# Patient Record
Sex: Female | Born: 1960 | Race: White | Hispanic: No | Marital: Married | Smoking: Never smoker
Health system: Southern US, Community
[De-identification: ages and names within clinical notes are randomized; demographics above are authoritative.]

## PROBLEM LIST (undated history)

## (undated) DIAGNOSIS — H269 Unspecified cataract: Secondary | ICD-10-CM

## (undated) DIAGNOSIS — Z91018 Allergy to other foods: Secondary | ICD-10-CM

## (undated) DIAGNOSIS — A6920 Lyme disease, unspecified: Secondary | ICD-10-CM

## (undated) DIAGNOSIS — I219 Acute myocardial infarction, unspecified: Secondary | ICD-10-CM

## (undated) DIAGNOSIS — B2790 Infectious mononucleosis, unspecified without complication: Secondary | ICD-10-CM

## (undated) DIAGNOSIS — T782XXA Anaphylactic shock, unspecified, initial encounter: Secondary | ICD-10-CM

## (undated) HISTORY — PX: TUBAL LIGATION: SHX77

## (undated) HISTORY — PX: ABDOMINAL HYSTERECTOMY: SHX81

## (undated) HISTORY — DX: Unspecified cataract: H26.9

---

## 2007-10-11 ENCOUNTER — Encounter: Admission: RE | Admit: 2007-10-11 | Discharge: 2007-10-11 | Payer: Self-pay | Admitting: Family Medicine

## 2007-11-03 ENCOUNTER — Encounter: Admission: RE | Admit: 2007-11-03 | Discharge: 2007-11-03 | Payer: Self-pay | Admitting: Family Medicine

## 2009-03-12 ENCOUNTER — Encounter: Admission: RE | Admit: 2009-03-12 | Discharge: 2009-03-12 | Payer: Self-pay | Admitting: Family Medicine

## 2010-12-06 ENCOUNTER — Encounter: Payer: Self-pay | Admitting: Family Medicine

## 2011-01-04 ENCOUNTER — Emergency Department (HOSPITAL_COMMUNITY): Payer: BC Managed Care – PPO

## 2011-01-04 ENCOUNTER — Observation Stay (HOSPITAL_COMMUNITY)
Admission: EM | Admit: 2011-01-04 | Discharge: 2011-01-05 | DRG: 102 | Disposition: A | Discharge status: Home / Self Care | Payer: BC Managed Care – PPO | Attending: Internal Medicine | Admitting: Internal Medicine

## 2011-01-04 ENCOUNTER — Encounter: Payer: Self-pay | Admitting: Cardiology

## 2011-01-04 DIAGNOSIS — R079 Chest pain, unspecified: Principal | ICD-10-CM | POA: Insufficient documentation

## 2011-01-04 DIAGNOSIS — F329 Major depressive disorder, single episode, unspecified: Secondary | ICD-10-CM | POA: Insufficient documentation

## 2011-01-04 DIAGNOSIS — F411 Generalized anxiety disorder: Secondary | ICD-10-CM | POA: Insufficient documentation

## 2011-01-04 DIAGNOSIS — F3289 Other specified depressive episodes: Secondary | ICD-10-CM | POA: Insufficient documentation

## 2011-01-04 LAB — DIFFERENTIAL
Basophils Absolute: 0 10*3/uL (ref 0.0–0.1)
Eosinophils Absolute: 0.2 10*3/uL (ref 0.0–0.7)
Eosinophils Relative: 2 % (ref 0–5)
Lymphocytes Relative: 30 % (ref 12–46)
Lymphs Abs: 2.3 10*3/uL (ref 0.7–4.0)

## 2011-01-04 LAB — RAPID URINE DRUG SCREEN, HOSP PERFORMED
Amphetamines: NOT DETECTED
Cocaine: NOT DETECTED
Tetrahydrocannabinol: NOT DETECTED

## 2011-01-04 LAB — URINALYSIS, ROUTINE W REFLEX MICROSCOPIC
Bilirubin Urine: NEGATIVE
Hgb urine dipstick: NEGATIVE
Ketones, ur: NEGATIVE mg/dL
Nitrite: NEGATIVE
Urine Glucose, Fasting: NEGATIVE mg/dL
Urobilinogen, UA: 1 mg/dL (ref 0.0–1.0)
pH: 6.5 (ref 5.0–8.0)

## 2011-01-04 LAB — CK TOTAL AND CKMB (NOT AT ARMC)
CK, MB: 1.3 ng/mL (ref 0.3–4.0)
Relative Index: INVALID (ref 0.0–2.5)
Total CK: 77 U/L (ref 7–177)

## 2011-01-04 LAB — POCT CARDIAC MARKERS
CKMB, poc: <1 ng/mL — ABNORMAL LOW (ref 1.0–8.0)
Troponin i, poc: <0.05 ng/mL (ref 0.00–0.09)

## 2011-01-04 LAB — CBC
Hemoglobin: 13.7 g/dL (ref 12.0–15.0)
MCH: 30.6 pg (ref 26.0–34.0)
RDW: 12.6 % (ref 11.5–15.5)
WBC: 7.8 10*3/uL (ref 4.0–10.5)

## 2011-01-04 LAB — COMPREHENSIVE METABOLIC PANEL
ALT: 34 U/L (ref 0–35)
Alkaline Phosphatase: 70 U/L (ref 39–117)
BUN: 14 mg/dL (ref 6–23)
CO2: 28 mEq/L (ref 19–32)
Calcium: 9.3 mg/dL (ref 8.4–10.5)
Chloride: 104 mEq/L (ref 96–112)
Creatinine, Ser: 0.87 mg/dL (ref 0.4–1.2)
Sodium: 138 mEq/L (ref 135–145)
Total Bilirubin: 0.5 mg/dL (ref 0.3–1.2)

## 2011-01-04 LAB — BRAIN NATRIURETIC PEPTIDE: Pro B Natriuretic peptide (BNP): <30 pg/mL (ref 0.0–100.0)

## 2011-01-04 LAB — PROTIME-INR: Prothrombin Time: 12.2 seconds (ref 11.6–15.2)

## 2011-01-04 LAB — TROPONIN I: Troponin I: <0.01 ng/mL (ref 0.00–0.06)

## 2011-01-05 DIAGNOSIS — R072 Precordial pain: Secondary | ICD-10-CM

## 2011-01-05 LAB — CBC
HCT: 41 % (ref 36.0–46.0)
Hemoglobin: 13.8 g/dL (ref 12.0–15.0)
MCH: 30.9 pg (ref 26.0–34.0)
MCV: 91.9 fL (ref 78.0–100.0)
RDW: 12.7 % (ref 11.5–15.5)
WBC: 7.2 10*3/uL (ref 4.0–10.5)

## 2011-01-05 LAB — BASIC METABOLIC PANEL
Chloride: 107 mEq/L (ref 96–112)
GFR calc Af Amer: >60 mL/min (ref >60)
Sodium: 140 mEq/L (ref 135–145)

## 2011-01-05 LAB — LIPID PANEL
LDL Cholesterol: 122 mg/dL — ABNORMAL HIGH (ref 0–99)
VLDL: 16 mg/dL (ref 0–40)

## 2011-01-05 LAB — TSH: TSH: 3.34 u[IU]/mL (ref 0.350–4.500)

## 2011-01-05 LAB — CK TOTAL AND CKMB (NOT AT ARMC)
CK, MB: 1.2 ng/mL (ref 0.3–4.0)
Relative Index: INVALID (ref 0.0–2.5)
Relative Index: INVALID (ref 0.0–2.5)

## 2011-01-05 NOTE — Discharge Summary (Signed)
  NAMEAITHANA, [name]                ACCOUNT NO.:  [id number]  MEDICAL RECORD NO.:  [id number]           PATIENT TYPE:  I  LOCATION:  3713                         FACILITY:  [facility]  PHYSICIAN:  Conley Canal, MD      DATE OF BIRTH:  Oct 29, 1961  DATE OF ADMISSION:  01/04/2011 DATE OF DISCHARGE:  01/05/2011                        DISCHARGE SUMMARY - REFERRING   PRIMARY CARE PROVIDER:  [facility].  DISCHARGE DIAGNOSES: 1. Chest pain, most likely costochondritis, myocardial infarction     ruled out by serial cardiac enzymes. 2. Anxiety/depression disorder. 3. Status post hysterectomy.  DISCHARGE MEDICATIONS: 1. Aspirin 81 mg daily. 2. Ativan 0.25 mg twice daily as needed. 3. Nitroglycerin 0.4 mg sublingually every 5 minutes as needed. 4. EpiPen 0.3 mg injection subcutaneously as needed for allergy. 5. Fish oil 2500 mg daily. 6. Melatonin 5 mg nightly. 7. Multivitamins 1 tablet daily. 8. Prozac 10 mg daily.  PROCEDURES PERFORMED:  A 2-D echocardiogram planned.  Chest x-ray showed no evidence of active cardiopulmonary disease.  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  She was noted to have some EKG changes at her PCP's office, some Q-waves in inferior leads, hence referral to the emergency room.  In the emergency room, the patient had serial cardiac enzymes which were negative x3.  Other labs included a BNP, TSH, urine drug screen which were all normal and lipids panel showing total cholesterol 190, HDL 52, LDL 122.  Her chest pain improved with pain medications including nitroglycerin and morphine.  The pain might be noncardiac in nature; however, given the EKG changes, the patient is going to have a 2-D echocardiogram and she prefers to have this followed by her primary care provider and she will arrange with her primary care provider for  an outpatient stress test.  The plan is to discharge her once the 2-D echocardiogram is done.  I will call her with the echocardiogram results.  Otherwise, she is discharged in stable condition.  The time spent for this discharge preparation is less than 30 minutes.     Conley Canal, MD     SR/MEDQ  D:  01/05/2011  T:  01/05/2011  Job:  [id number]  cc:   [facility]  Electronically Signed by Conley Canal  on 01/05/2011 06:30:38 PM

## 2011-01-14 ENCOUNTER — Encounter: Payer: Self-pay | Admitting: Cardiology

## 2011-01-15 ENCOUNTER — Encounter: Payer: Self-pay | Admitting: Cardiology

## 2011-01-15 ENCOUNTER — Institutional Professional Consult (permissible substitution) (INDEPENDENT_AMBULATORY_CARE_PROVIDER_SITE_OTHER): Payer: BC Managed Care – PPO | Admitting: Cardiology

## 2011-01-15 DIAGNOSIS — R0602 Shortness of breath: Secondary | ICD-10-CM | POA: Insufficient documentation

## 2011-01-15 DIAGNOSIS — F411 Generalized anxiety disorder: Secondary | ICD-10-CM | POA: Insufficient documentation

## 2011-01-15 DIAGNOSIS — R0789 Other chest pain: Secondary | ICD-10-CM | POA: Insufficient documentation

## 2011-01-19 ENCOUNTER — Encounter: Payer: BC Managed Care – PPO | Admitting: Physician Assistant

## 2011-01-19 ENCOUNTER — Encounter (INDEPENDENT_AMBULATORY_CARE_PROVIDER_SITE_OTHER): Payer: BC Managed Care – PPO | Admitting: Physician Assistant

## 2011-01-19 ENCOUNTER — Encounter: Payer: Self-pay | Admitting: Physician Assistant

## 2011-01-19 DIAGNOSIS — R079 Chest pain, unspecified: Secondary | ICD-10-CM

## 2011-01-21 NOTE — Assessment & Plan Note (Signed)
Summary: consult: intermittent chestpain. per [name] office [phone]. n...   Visit Type:  Initial Consult Primary Provider:  Leodis Sias, MD  [facility])  CC:  chest pain.  History of Present Illness: The patient presents for evaluation of chest discomfort. This had  happened a few weeks ago.  She put evaluation on for about 6 days. She finally saw her primary care doctor and was noted to have poor anterior R wave progression. She was admitted overnight to the hospital and ruled out. She subsequently ruled out for myocardial infarction. He had an echocardiogram which demonstrated perhaps a mildly reduced ejection fraction but otherwise no significant abnormalities. Since that time she has continued to have some discomfort. This happens if she exerts herself. She tried to do some walking but has had some reservations about doing this. She has also had what she describes as a "panic attack". She described the initial discomfort as mid chest lasting 20 minutes and associated with shortness of breath. It came on spontaneously at the time and went away spontaneously. She has been under quite a bit of stress in her husband is dying of cancer. He has now lost most of his vision and hearing. 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  Current Medications (verified): 1)  Ativan 0.5 Mg Tabs (Lorazepam) .... 1/2 By Mouth As Needed 2)  Prozac 10 Mg Caps (Fluoxetine Hcl) .Marland Kitchen.. 1 By Mouth At Bedtime 3)  Multivitamins   Tabs (Multiple Vitamin) .Marland Kitchen.. 1 By Mouth Daily 4)  Aspirin 81 Mg  Tabs (Aspirin) .Marland Kitchen.. 1 By Mouth Dialy 5)  Fiber (Guar Gum)  Chew (Guar Gum) .Marland Kitchen.. 1 By Mouth Daily 6)  Dhea 25 Mg Tabs (Prasterone (Dhea)) .Marland Kitchen.. 1 By Mouth Daily 7)  Melatonin 5 Mg Tabs (Melatonin) .Marland Kitchen.. 1 By Mouth Daily 8)  Omega-3 350 Mg Caps (Omega-3 Fatty Acids) .Marland Kitchen.. 1 By Mouth Daily 9)  Nitrostat 0.4 Mg Subl (Nitroglycerin) .... As Needed 10)  Epipen 2-Pak 0.3 Mg/0.56ml Devi (Epinephrine) .... Uad  Allergies  (verified): 1)  ! Demerol  Past History:  Past Medical History: Anxiety/depression  Past Surgical History: Hysterectomy  Family History: Her father had bypass surgery at a later age. Otherwise there is no early onset heart disease.  Social History: She is married. She has no children. She does not smoke cigarettes and never has. She doesn't drink alcohol.  Review of Systems       As stated in the HPI and negative for all other systems.   Vital Signs:  Patient profile:   50 year old female Height:      62 inches Weight:      148 pounds BMI:     27.17 Pulse rate:   73 / minute Resp:     16 per minute BP sitting:   102 / 72  (right arm)  Vitals Entered By: Marrion Coy, CNA (January 15, 2011 3:29 PM)  Physical Exam  General:  Well developed, well nourished, in no acute distress. Head:  normocephalic and atraumatic Eyes:  PERRLA/EOM intact; conjunctiva and lids normal. Mouth:  Teeth, gums and palate normal. Oral mucosa normal. Neck:  Neck supple, no JVD. No masses, thyromegaly or abnormal cervical nodes. Chest Wall:  no deformities or breast masses noted Lungs:  Clear bilaterally to auscultation and percussion. Abdomen:  Bowel sounds positive; abdomen soft and non-tender without masses, organomegaly, or hernias noted. No hepatosplenomegaly. Msk:  Back normal, normal gait. Muscle strength and tone normal. Extremities:  No clubbing or cyanosis. Neurologic:  Alert and oriented x 3. Skin:  Intact without lesions or rashes. Cervical Nodes:  no significant adenopathy Axillary Nodes:  no significant adenopathy Inguinal Nodes:  no significant adenopathy Psych:  Normal affect.   Detailed Cardiovascular Exam  Neck    Carotids: Carotids full and equal bilaterally without bruits.      Neck Veins: Normal, no JVD.    Heart    Inspection: no deformities or lifts noted.      Palpation: normal PMI with no thrills palpable.      Auscultation: regular rate and rhythm, S1, S2  without murmurs, rubs, gallops, or clicks.    Vascular    Abdominal Aorta: no palpable masses, pulsations, or audible bruits.      Femoral Pulses: normal femoral pulses bilaterally.      Pedal Pulses: normal pedal pulses bilaterally.      Radial Pulses: normal radial pulses bilaterally.      Peripheral Circulation: no clubbing, cyanosis, or edema noted with normal capillary refill.     EKG  Procedure date:  01/14/2011  Findings:      Sinus rhythm, rate 64, axis within normal limits, intervals within normal limits, no acute ST-T wave changes  Impression & Recommendations:  Problem # 1:  CHEST DISCOMFORT (ICD-786.59) The patient's chest discomfort has some atypical features. However, I cannot exclude obstructive coronary disease. She needs an exercise treadmill test to evaluate for this.  Problem # 2:  ANXIETY (ICD-300.00) She certainly has significant social stressors. No change in therapy is indicated but I will defer to her primary provider.  Other Orders: Treadmill (Treadmill)  Patient Instructions: 1)  Your physician recommends that you schedule a follow-up appointment at the time of your treadmill. 2)  Your physician recommends that you continue on your current medications as directed. Please refer to the Current Medication list given to you today. 3)  Your physician has requested that you have an exercise tolerance test.  For further information please visit [email or web address]  Please also follow instruction sheet, as given.

## 2011-01-21 NOTE — H&P (Signed)
NAMECORDELL, COKE                ACCOUNT NO.:  [id number]  MEDICAL RECORD NO.:  [id number]           PATIENT TYPE:  E  LOCATION:  [facility]                         FACILITY:  [facility]  PHYSICIAN:  Lonia Blood, M.D.      DATE OF BIRTH:  [birth year]  DATE OF ADMISSION:  01/04/2011 DATE OF DISCHARGE:                             HISTORY & PHYSICAL   PRIMARY CARE PHYSICIAN:  [facility].  PRESENTING COMPLAINT:  Chest pain.  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  Her husband is apparently sick with cancer for the past 2 years.  She has been taking care of him and this is said to be terminal.  She has been also working more and more.  Last Tuesday, she started experiencing left-sided chest pain.  The patient described it as 6-7/10, sharp, on her left axilla.  It went away on its own but cameback again 2 days ago.  She went to see her primary care physician at which point they did basic labs including checking an EKG.  Her EKG then showed Q-waves in the anterior leads.  The patient received aspirin and some nitroglycerin at that time and she seemed to have gotten better. They referred her over here for further workup.  The pain has since been resolved.  It is mainly in the left side.  No radiation.  No nausea or vomiting.  The patient denied any diaphoresis.  No aggravating or relieving factor but she believes that is all stress.  PAST MEDICAL HISTORY:  Significant for depression and anxiety.  The patient also has prior history of syncopal episodes some years ago, also a history of anaphylaxis.  ALLERGIES:  DEMEROL.  CURRENT MEDICATIONS: 1. EpiPen. 2. Also uses Prozac 10 mg daily. 3. Estradiol patch daily. 4. Multivitamin 1 tablet daily. 5. Melatonin 5 mg daily. 6. Fish oil 2500 mg daily.  PAST SURGICAL HISTORY:  Status post hysterectomy with  1 ovary removed.  SOCIAL HISTORY:  She is married and lives with her husband who is sick. She denied tobacco, alcohol, or IV drug use.  She works as a Engineer, site.  FAMILY HISTORY:  Her father is 51, just had quadruple bypass.  Her grandmother apparently had history of coronary artery disease at an early age in the 68s.  Otherwise, no first-degree relative with early coronary artery disease.  REVIEW OF SYSTEMS:  All systems reviewed are currently negative except per HPI.  PHYSICAL EXAMINATION:  VITAL SIGNS:  On exam, temperature is 98.3, blood pressure 109/65, pulse 81, respiratory 20, sats 100% on room air. GENERAL:  She is awake, alert, oriented.  She is in no acute distress. HEENT:  PERRL.  EOMI.  No pallor, no jaundice, no rhinorrhea. NECK:  Supple.  No JVD, no lymphadenopathy. RESPIRATORY:  She has good air entry bilateral.  No wheezes.  No rales. No crackles. CARDIOVASCULAR:  She has S1 and S2.  No audible murmur. ABDOMEN:  Soft full, nontender with positive bowel sounds. EXTREMITIES:  No  edema, cyanosis, or clubbing. SKIN:  No rashes or ulcers.  LABORATORY DATA:  Urinalysis is negative.  White count 7.8, hemoglobin 13.7, with platelet of 245.  Normal differentials and normal MCV. Initial cardiac enzymes are negative.  Urine drug screen is also negative.  PT 12.2 and INR 0.89, PTT of 28.  Sodium 138, potassium 4.3, chloride 104, CO2 of 28, glucose 97, BUN 14, creatinine 0.87, calcium 9.3, total protein 6.7, albumin 3.7.  Chest x-ray showed no active disease.  Her EKG showed normal sinus rhythm with a rate of 67, normal intervals.  There is some flattening of some T-waves in the anterior leads and visible Q-waves, however no old EKG to compare.  Not sure if this is chronic.  There is also evidence of some low voltage.  ASSESSMENT:  This is a 50 year old female presenting with left-sided chest pain and subtle EKG change, probably chronic.  The patient's risk factors are  mild-to-moderate at best.  PLAN: 1. Chest pain.  We will admit the patient for overnight observation.     Check serial cardiac enzymes.  Due to the abnormal EKG, I will     check a 2-D echo.  If her numbers are all negative, mainly the     enzymes, and the echo is not abnormal, the patient will be     scheduled for outpatient stress testing.  She will rather have it     as an outpatient than inpatient and we will go with that.  In the     meantime, I gave her some aspirin, morphine, and sublingual     nitroglycerin as needed. 2. Depression and anxiety.  I will continue with the Prozac.  I will     give her Ativan as needed to calm her down while in the hospital. 3. History of syncope.  No evidence of syncopal episode at this point.     Further treatment depends on our findings.     Lonia Blood, M.D.     Verlin Grills  D:  01/04/2011  T:  01/04/2011  Job:  [id number]  Electronically Signed by Lonia Blood M.D. on 01/20/2011 04:21:56 PM

## 2011-02-04 ENCOUNTER — Other Ambulatory Visit: Payer: Self-pay | Admitting: Family Medicine

## 2011-02-04 DIAGNOSIS — Z1231 Encounter for screening mammogram for malignant neoplasm of breast: Secondary | ICD-10-CM

## 2011-02-18 ENCOUNTER — Ambulatory Visit
Admission: RE | Admit: 2011-02-18 | Discharge: 2011-02-18 | Disposition: A | Discharge status: Home / Self Care | Payer: BC Managed Care – PPO | Source: Ambulatory Visit | Attending: Family Medicine | Admitting: Family Medicine

## 2011-02-18 DIAGNOSIS — Z1231 Encounter for screening mammogram for malignant neoplasm of breast: Secondary | ICD-10-CM

## 2013-03-26 ENCOUNTER — Other Ambulatory Visit: Payer: Self-pay

## 2013-03-26 DIAGNOSIS — Z1231 Encounter for screening mammogram for malignant neoplasm of breast: Secondary | ICD-10-CM

## 2013-04-24 ENCOUNTER — Ambulatory Visit: Payer: BC Managed Care – PPO

## 2013-08-13 ENCOUNTER — Encounter: Payer: Self-pay | Admitting: Physician Assistant

## 2013-11-28 ENCOUNTER — Emergency Department (HOSPITAL_COMMUNITY)
Admission: EM | Admit: 2013-11-28 | Discharge: 2013-11-28 | Disposition: A | Discharge status: Home / Self Care | Payer: BC Managed Care – PPO | Attending: Emergency Medicine | Admitting: Emergency Medicine

## 2013-11-28 ENCOUNTER — Encounter (HOSPITAL_COMMUNITY): Payer: Self-pay | Admitting: Emergency Medicine

## 2013-11-28 DIAGNOSIS — R21 Rash and other nonspecific skin eruption: Secondary | ICD-10-CM | POA: Insufficient documentation

## 2013-11-28 DIAGNOSIS — T4995XA Adverse effect of unspecified topical agent, initial encounter: Secondary | ICD-10-CM | POA: Insufficient documentation

## 2013-11-28 DIAGNOSIS — T7840XA Allergy, unspecified, initial encounter: Secondary | ICD-10-CM

## 2013-11-28 DIAGNOSIS — I252 Old myocardial infarction: Secondary | ICD-10-CM | POA: Insufficient documentation

## 2013-11-28 HISTORY — DX: Acute myocardial infarction, unspecified: I21.9

## 2013-11-28 HISTORY — DX: Anaphylactic shock, unspecified, initial encounter: T78.2XXA

## 2013-11-28 MED ORDER — FAMOTIDINE 20 MG PO TABS
40.0000 mg | ORAL_TABLET | Freq: Once | ORAL | Status: AC
  Administered 2013-11-28: 40 mg via ORAL
  Filled 2013-11-28: qty 2

## 2013-11-28 MED ORDER — PREDNISONE 20 MG PO TABS: 40.0000 mg | ORAL_TABLET | Freq: Every day | ORAL | Status: DC

## 2013-11-28 MED ORDER — PREDNISONE 50 MG PO TABS
60.0000 mg | ORAL_TABLET | Freq: Once | ORAL | Status: AC
  Administered 2013-11-28: 60 mg via ORAL
  Filled 2013-11-28 (×2): qty 1

## 2013-11-28 MED ORDER — DIPHENHYDRAMINE HCL 25 MG PO CAPS
25.0000 mg | ORAL_CAPSULE | Freq: Once | ORAL | Status: AC
  Administered 2013-11-28: 25 mg via ORAL
  Filled 2013-11-28: qty 1

## 2013-11-28 NOTE — ED Provider Notes (Signed)
CSN: [id number]     Arrival date & time 11/28/13  0003 History   First MD Initiated Contact with Patient 11/28/13 0034     Chief Complaint  Patient presents with  . Allergic Reaction    HPI Pt was seen at 0100. Per pt, c/o gradual onset and persistence of waxing and waning "red itching rash" to bilat LE's for the past 3 days. Pt states the rash began shortly after she had a pedicure. Rash has been associated with intermittent "itching hands" and "feeling like my throat is closing." Pt states she has been intermittently taking one OTC benadryl with relief of her symptoms; LD PTA. Denies fevers, no generalized hives, no SOB, no CP, no intra-oral edema, no wheezing/stridor.    Past Medical History  Diagnosis Date  . Idiopathic anaphylactic reaction   . Heart attack    Past Surgical History  Procedure Laterality Date  . Abdominal hysterectomy      History  Substance Use Topics  . Smoking status: Never Smoker   . Smokeless tobacco: Not on file  . Alcohol Use: Yes     Comment: wine occasionally    Review of Systems ROS: Statement: All systems negative except as marked or noted in the HPI; Constitutional: Negative for fever and chills. ; ; Eyes: Negative for eye pain, redness and discharge. ; ; ENMT: Negative for ear pain, hoarseness, nasal congestion, sinus pressure and sore throat. ; ; Cardiovascular: Negative for chest pain, palpitations, diaphoresis, dyspnea and peripheral edema. ; ; Respiratory: Negative for cough, wheezing and stridor. ; ; Gastrointestinal: Negative for nausea, vomiting, diarrhea, abdominal pain, blood in stool, hematemesis, jaundice and rectal bleeding. . ; ; Genitourinary: Negative for dysuria, flank pain and hematuria. ; ; Musculoskeletal: Negative for back pain and neck pain. Negative for swelling and trauma.; ; Skin: +itching rash. Negative for abrasions, blisters, bruising and skin lesion.; ; Neuro: Negative for headache, lightheadedness and neck stiffness.  Negative for weakness, altered level of consciousness , altered mental status, extremity weakness, paresthesias, involuntary movement, seizure and syncope.     Allergies  Mango flavor and Meperidine hcl  Home Medications  No current outpatient prescriptions on file. BP 154/81  Pulse 60  Temp(Src) 97.3 F (36.3 C) (Oral)  Resp 16  Ht 5\' 2"  (1.575 m)  Wt 137 lb (62.143 kg)  BMI 25.05 kg/m2  SpO2 99% Physical Exam 0105: Physical examination:  Nursing notes reviewed; Vital signs and O2 SAT reviewed;  Constitutional: Well developed, Well nourished, Well hydrated, In no acute distress; Head:  Normocephalic, atraumatic; Eyes: EOMI, PERRL, No scleral icterus; ENMT: Mouth and pharynx without lesions. No tonsillar exudates. No intra-oral edema. No submandibular or sublingual edema. No hoarse voice, no drooling, no stridor. No pain with manipulation of larynx. Mouth and pharynx normal, Mucous membranes moist; Neck: Supple, Full range of motion, No lymphadenopathy; Cardiovascular: Regular rate and rhythm, No murmur, rub, or gallop; Respiratory: Breath sounds clear & equal bilaterally, No rales, rhonchi, wheezes.  Speaking full sentences with ease, Normal respiratory effort/excursion; Chest: Nontender, Movement normal; Abdomen: Soft, Nontender, Nondistended, Normal bowel sounds; Genitourinary: No CVA tenderness; Extremities: Pulses normal, No tenderness, No edema, No calf edema or asymmetry. +faint erythematous rash to bilat LE's.; Neuro: AA&Ox3, Major CN grossly intact.  Speech clear. No gross focal motor or sensory deficits in extremities.; Skin: Color normal, Warm, Dry. No hives. No petechiae.    ED Course  Procedures   EKG Interpretation   None       MDM  MDM Reviewed: previous chart, nursing note and vitals      0200:  Feels improved after meds and wants to go home now. Red rash on bilat LE's fading. No generalized rash. No hoarse voice/drooling/stridor, no wheezing. Resps easy, Sats  99% R/A, VSS. Dx d/w pt and family.  Questions answered.  Verb understanding, agreeable to d/c home with outpt f/u.   Laray AngerKathleen M Chrisopher Pustejovsky, DO 11/29/13 1433

## 2013-11-28 NOTE — Discharge Instructions (Signed)
°Emergency Department Resource Guide °1) Find a Doctor and Pay Out of Pocket °Although you won't have to find out who is covered by your insurance plan, it is a good idea to ask around and get recommendations. You will then need to call the office and see if the doctor you have chosen will accept you as a new patient and what types of options they offer for patients who are self-pay. Some doctors offer discounts or will set up payment plans for their patients who do not have insurance, but you will need to ask so you aren't surprised when you get to your appointment. ° °2) Contact Your Local Health Department °Not all health departments have doctors that can see patients for sick visits, but many do, so it is worth a call to see if yours does. If you don't know where your local health department is, you can check in your phone book. The CDC also has a tool to help you locate your state's health department, and many state websites also have listings of all of their local health departments. ° °3) Find a Walk-in Clinic °If your illness is not likely to be very severe or complicated, you may want to try a walk in clinic. These are popping up all over the country in pharmacies, drugstores, and shopping centers. They're usually staffed by nurse practitioners or physician assistants that have been trained to treat common illnesses and complaints. They're usually fairly quick and inexpensive. However, if you have serious medical issues or chronic medical problems, these are probably not your best option. ° °No Primary Care Doctor: °- Call [facility] at  832-8000 - they can help you locate a primary care doctor that  accepts your insurance, provides certain services, etc. °- Physician Referral Service- 1-800-533-3463 ° °Chronic Pain Problems: °Organization         Address  Phone   Notes  °[facility]  (336) 297-2271 Patients need to be referred by their primary care doctor.  ° °Medication  Assistance: °Organization         Address  Phone   Notes  °[place] [facility] [address] °[place], [place] [address] (336) 641-8030 --Must be a resident of [place] °-- Must have NO insurance coverage whatsoever (no Medicaid/ Medicare, etc.) °-- The pt. MUST have a primary care doctor that directs their care regularly and follows them in the community °  °[facility]  (866) 331-1348   °[employer]  (888) 892-1162   ° °Agencies that provide inexpensive medical care: °Organization         Address  Phone   Notes  °[facility]  (336) 832-8035   °[facility]    (336) 832-7272   °[facility] [address] °[place],  [address] (336) 832-4777   °[facility] [address], °[place] (336) 271-4999   °[facility]    (336) 373-0678   °[facility]    (336) 272-1050   °[facility] ° [address], [place] Phone:  (336) 832-4444, Fax:  (336) 832-4440 Hours of Operation:  9 am - 6 pm, M-F.  Also accepts Medicaid/Medicare and self-pay.  °[facility] ° [address], [place] Phone: (336) 832-3150, Fax: (336) 832-3151. Hours of Operation:  8:30 am - 5:30 pm, M-F.  Also accepts Medicaid and self-pay.  °[facility] [place] [address], [place] Phone: (336) 878-6027   °[facility] [address], [place], [place] (336)723-1848, Ext. 123 Mondays & Thursdays: 7-9 AM.  First 15 patients are seen on a first come, first serve basis. °  ° °Medicaid-accepting [place] Providers: ° °Organization         Address  Phone   Notes  °[facility] [address], [place] (336) 641-2100 Also accepts self-pay patients.  °[facility] [address], [place] ° (336) 856-9996   °[facility] [address], [place]  (336) 288-8857   °[facility] [address] [place] [address], [place] (336) 299-7000   °Veita Bland [address], [place]  ° (336) 373-1557 Only accepts [place] Access Medicaid patients after they have their name applied to their card.  ° °Self-Pay (no insurance) in [place]: ° °Organization         Address  Phone   Notes  °Sickle Cell Patients, [facility] [address], [place] (336) 832-1970   °[facility] [address], [place] (336) 832-4400   °[facility] [place] ° [address], [place] (336) 992-4800   °[facility]/Dr. Osei-Bonsu ° [address] [place] [address], [place] or [address], [place] (336) 841-8500 Phone number for both [place] and [place] locations is the same.  °[facility] [address], [place] (336) 299-0000   °[facility] [address] [place] [address], [place] or [address] (336) 852-7530 °(336) 878-2260   °[facility] [address], [place] (336) 350-1642, phone; (336) 294-5005, fax Sees patients 1st and 3rd Saturday of every month.  Must not qualify for public or private insurance (i.e. Medicaid, Medicare, [place] Health Choice, Veterans' Benefits) • Household income should be no more than 200% of the poverty level •The clinic cannot treat you if you are pregnant or think you are pregnant • Sexually transmitted diseases are not treated at the clinic.  ° ° °Dental Care: °Organization         Address  Phone  Notes  °[place] [facility] [address], [place] (336) 641-6152 Accepts children up to age 21 who are enrolled in Medicaid or [place] Health Choice; pregnant women with a Medicaid card; and children who have applied for Medicaid or [place] Health Choice, but were declined, whose parents can pay a reduced fee at time of service.  °[place]  [facility] [place]  [address], [place] (336) 641-7733 Accepts children up to age 21 who are enrolled in Medicaid or [place] Health Choice; pregnant women with a Medicaid card; and children who have applied for Medicaid or [place] Health Choice, but were declined, whose parents can pay a reduced fee at time of service.  °[facility] ° [address], [place] (336) 641-4533 Patients are seen by appointment only. Walk-ins are not accepted. [facility] will see patients 18 years of age and older. °Monday - Tuesday (8am-5pm) °Most Wednesdays (8:30-5pm) °$30 per visit, cash only  °[facility] ° [address], [place] (336) 641-4533 Patients are seen by appointment only. Walk-ins are not accepted. [facility] will see patients 18 years of age and older. °One   Wednesday Evening (Monthly: Volunteer Based).  $30 per visit, cash only  °[facility]  (919) 537-3737 for adults; Children under age 4, call Graduate Pediatric Dentistry at ([phone]) [phone]. Children aged 4-14, please call (919) [phone] to request a pediatric application. ° Dental services are provided in all areas of dental care including fillings, crowns and bridges, complete and partial dentures, implants, gum treatment, root canals, and extractions. Preventive care is also provided. Treatment is provided to both adults and children. °Patients are selected via a lottery and there is often a waiting list. °  °[facility] [address], °[place] ° (336) 763-8833 [email or web address] °  °[facility] [address], [place], [place] (336)723-1848, Ext. 123 Second and Fourth Thursday of each month, opens at 6:30 AM; Clinic ends at 9 AM.  Patients are seen on a first-come first-served basis, and a limited number are seen during each clinic.  ° °[facility] ° [address], [place], [place] (336) 723-7904    Eligibility Requirements °You must have lived in [place], or [place] counties for at least the last three months. °  You cannot be eligible for state or federal sponsored healthcare insurance, including Veterans Administration, Medicaid, or Medicare. °  You generally cannot be eligible for healthcare insurance through your employer.  °  How to apply: °Eligibility screenings are held every Tuesday and Wednesday afternoon from 1:00 pm until 4:00 pm. You do not need an appointment for the interview!  °[facility] [address], [place], [place] 336-631-2330   °[facility] [facility]  336-342-8273   °[facility]  336-703-3100   °[facility]  336-570-6415   ° °Behavioral Health Resources in the Community: °Intensive Outpatient Programs °Organization         Address  Phone  Notes  °[facility] [address], [place], [place] 336-878-6098   °[facility] [address], [place], [place] 336-832-9800   °[facility] [address], [place], [place] ° 336-882-2125   °[facility] [address],  °[place], [place] 1-800-853-5163 or 336-641-4981   °Substance Abuse Resources °Organization         Address  Phone  Notes  °[facility]  336-882-2125   °[facility]  336-784-9470   °[facility]  336-285-9073   °[facility]  336-845-3988   °Residential & Outpatient Substance Abuse Program  1-800-659-3381   °Psychological Services °Organization         Address  Phone  Notes  °[facility]  336- 832-9600   °[facility]  336- 378-7881   °[facility] [address], [place] 1-800-853-5163 or 336-641-4981   ° °Mobile Crisis Teams °Organization         Address  Phone  Notes  °[facility], Mobile Crisis Care Unit  1-877-626-1772   °Assertive °[facility] ° [address]  [place], [place] 336-834-9664   °Sharon DeEsch [address] °[place] 336-554-5454   ° °Self-Help/Support Groups °Organization         Address  Phone             Notes  °[facility] - variety of support groups  336- 373-1402 Call for more information  °Narcotics Anonymous (NA), [facility] [address], °[place]   2 meetings at this location  ° °  Residential Treatment Programs °Organization         Address  Phone  Notes  °[facility] [address],    °[place]  1-866-801-8205   °[facility] ° [address], [place], [place] 704-293-8524   °[facility] [address], [place] 336-845-3988 Admissions: 8am-3pm M-F  °[facility] [address]    °[place], [place] 336-841-1104   °[facility] [address], [place], [place] 336-379-7146   °[facility] [address]  °[place], [place] 336-285-9073   °[facility] - Intensive Outpatient [address], [place], [place] 336-852-3033   °[facility] [address]  °[place], [place] 1-877-615-2722 or 336-784-9470   °[facility] [address] [place], [place] 336-227-7417 Accepts Medicaid  °[facility] [address]  °[place] 1-800-659-3381 Substance Abuse/Addiction Treatment  ° °[place] Behavioral Health Resources °Organization         Address  Phone  Notes  °[facility]  (888) 581-9988   °Julie Brannon, PhD [address] [place], [place]   (336) 349-5553 or (336) 951-0000   °[facility]   [address] °[place], [place] (336) 349-4454   °[facility] [address], [place], [place] (336) 342-8316 Insurance/Medicaid/sponsorship through [facility]  °[facility] [address]                                    [place], [place] (336) 342-8316 Therapy/tele-psych/case    °[facility] [address]  ° [place], [place] (336) 349-2233    °Dr. Arfeen  (336) 349-4544   °[facility] [place]  [employer] [place] [facility] 1) [address], [place] °2) [address], [place] °3)  [address], [place] (336) 349-3220 °(336) 342-7768 ° °(336) 342-8140   °[place] [facility] (336) 342-1394 or (336) 342-3537 (After Hours)    ° ° °Take the prescription as directed.  Take over the counter benadryl, as directed on packaging, as needed for itching.  If the benadryl is too sedating, take an over the counter non-sedating antihistamine such as claritin, allegra or zyrtec, as directed on packaging.  Call your regular medical doctor today to schedule a follow up appointment within the next 2 days.  Return to the Emergency Department immediately sooner if worsening.  ° °

## 2013-11-28 NOTE — ED Notes (Signed)
Rash to bi-lateral lower extremities. Feel like throat is closing, hard to speak. Denies difficulty breathing. Has an epi-pen but did not use it. Took a Benadryl and feel somewhat better. Started as a contact dermatitis on Saturday after a pedicure. Sunday I was itching. No respiratory problems. States that she took benadryl with some relief over the weekend.

## 2013-12-18 ENCOUNTER — Encounter (HOSPITAL_COMMUNITY): Payer: Self-pay | Admitting: Pharmacy Technician

## 2013-12-19 NOTE — Patient Instructions (Signed)
Your procedure is scheduled on: 12/24/2013  [facility] to [facility] at  800 AM.  Call this number if you have problems the morning of surgery: [phone]   Do not eat food or drink liquids :After Midnight.      Take these medicines the morning of surgery with A SIP OF WATER: none   Do not wear jewelry, make-up or nail polish.  Do not wear lotions, powders, or perfumes.   Do not shave 48 hours prior to surgery.  Do not bring valuables to the hospital.  Contacts, dentures or bridgework may not be worn into surgery.  Leave suitcase in the car. After surgery it may be brought to your room.  For patients admitted to the hospital, checkout time is 11:00 AM the day of discharge.   Patients discharged the day of surgery will not be allowed to drive home.  :     Please read over the following fact sheets that you were given: Coughing and Deep Breathing, Surgical Site Infection Prevention, Anesthesia Post-op Instructions and Care and Recovery After Surgery    Cataract A cataract is a clouding of the lens of the eye. When a lens becomes cloudy, vision is reduced based on the degree and nature of the clouding. Many cataracts reduce vision to some degree. Some cataracts make people more near-sighted as they develop. Other cataracts increase glare. Cataracts that are ignored and become worse can sometimes look white. The white color can be seen through the pupil. CAUSES   Aging. However, cataracts may occur at any age, even in newborns.   Certain drugs.   Trauma to the eye.   Certain diseases such as diabetes.   Specific eye diseases such as chronic inflammation inside the eye or a sudden attack of a rare form of glaucoma.   Inherited or acquired medical problems.  SYMPTOMS   Gradual, progressive drop in vision in the affected eye.   Severe, rapid visual loss. This most often happens when trauma is the cause.  DIAGNOSIS  To detect a cataract, an eye doctor examines the lens. Cataracts are  best diagnosed with an exam of the eyes with the pupils enlarged (dilated) by drops.  TREATMENT  For an early cataract, vision may improve by using different eyeglasses or stronger lighting. If that does not help your vision, surgery is the only effective treatment. A cataract needs to be surgically removed when vision loss interferes with your everyday activities, such as driving, reading, or watching TV. A cataract may also have to be removed if it prevents examination or treatment of another eye problem. Surgery removes the cloudy lens and usually replaces it with a substitute lens (intraocular lens, IOL).  At a time when both you and your doctor agree, the cataract will be surgically removed. If you have cataracts in both eyes, only one is usually removed at a time. This allows the operated eye to heal and be out of danger from any possible problems after surgery (such as infection or poor wound healing). In rare cases, a cataract may be doing damage to your eye. In these cases, your caregiver may advise surgical removal right away. The vast majority of people who have cataract surgery have better vision afterward. HOME CARE INSTRUCTIONS  If you are not planning surgery, you may be asked to do the following:  Use different eyeglasses.   Use stronger or brighter lighting.   Ask your eye doctor about reducing your medicine dose or changing medicines if it  is thought that a medicine caused your cataract. Changing medicines does not make the cataract go away on its own.   Become familiar with your surroundings. Poor vision can lead to injury. Avoid bumping into things on the affected side. You are at a higher risk for tripping or falling.   Exercise extreme care when driving or operating machinery.   Wear sunglasses if you are sensitive to bright light or experiencing problems with glare.  SEEK IMMEDIATE MEDICAL CARE IF:   You have a worsening or sudden vision loss.   You notice redness,  swelling, or increasing pain in the eye.   You have a fever.  Document Released: 11/01/2005 Document Revised: 10/21/2011 Document Reviewed: 06/25/2011 [facility] [place] Patient Information 2012 [place].PATIENT INSTRUCTIONS POST-ANESTHESIA  IMMEDIATELY FOLLOWING SURGERY:  Do not drive or operate machinery for the first twenty four hours after surgery.  Do not make any important decisions for twenty four hours after surgery or while taking narcotic pain medications or sedatives.  If you develop intractable nausea and vomiting or a severe headache please notify your doctor immediately.  FOLLOW-UP:  Please make an appointment with your surgeon as instructed. You do not need to follow up with anesthesia unless specifically instructed to do so.  WOUND CARE INSTRUCTIONS (if applicable):  Keep a dry clean dressing on the anesthesia/puncture wound site if there is drainage.  Once the wound has quit draining you may leave it open to air.  Generally you should leave the bandage intact for twenty four hours unless there is drainage.  If the epidural site drains for more than 36-48 hours please call the anesthesia department.  QUESTIONS?:  Please feel free to call your physician or the hospital operator if you have any questions, and they will be happy to assist you.

## 2013-12-20 ENCOUNTER — Encounter (HOSPITAL_COMMUNITY): Payer: Self-pay

## 2013-12-20 ENCOUNTER — Encounter (HOSPITAL_COMMUNITY)
Admission: RE | Admit: 2013-12-20 | Discharge: 2013-12-20 | Disposition: A | Discharge status: Home / Self Care | Payer: BC Managed Care – PPO | Source: Ambulatory Visit | Attending: Ophthalmology | Admitting: Ophthalmology

## 2013-12-20 ENCOUNTER — Other Ambulatory Visit: Payer: Self-pay

## 2013-12-20 DIAGNOSIS — Z01810 Encounter for preprocedural cardiovascular examination: Secondary | ICD-10-CM | POA: Insufficient documentation

## 2013-12-20 DIAGNOSIS — Z01812 Encounter for preprocedural laboratory examination: Secondary | ICD-10-CM | POA: Insufficient documentation

## 2013-12-20 LAB — BASIC METABOLIC PANEL
BUN: 21 mg/dL (ref 6–23)
CALCIUM: 9.9 mg/dL (ref 8.4–10.5)
CO2: 29 meq/L (ref 19–32)
CREATININE: 0.8 mg/dL (ref 0.50–1.10)
Chloride: 102 mEq/L (ref 96–112)
GFR calc Af Amer: >90 mL/min (ref >90)
GFR, EST NON AFRICAN AMERICAN: 83 mL/min — AB (ref >90)
Glucose, Bld: 107 mg/dL — ABNORMAL HIGH (ref 70–99)
Potassium: 4.9 mEq/L (ref 3.7–5.3)
SODIUM: 143 meq/L (ref 137–147)

## 2013-12-20 LAB — HEMOGLOBIN AND HEMATOCRIT, BLOOD
HEMATOCRIT: 42.3 % (ref 36.0–46.0)
Hemoglobin: 14.3 g/dL (ref 12.0–15.0)

## 2013-12-21 MED ORDER — CYCLOPENTOLATE-PHENYLEPHRINE OP SOLN OPTIME - NO CHARGE
OPHTHALMIC | Status: AC
  Filled 2013-12-21: qty 2

## 2013-12-21 MED ORDER — TETRACAINE HCL 0.5 % OP SOLN
OPHTHALMIC | Status: AC
  Filled 2013-12-21: qty 2

## 2013-12-21 MED ORDER — NEOMYCIN-POLYMYXIN-DEXAMETH 3.5-10000-0.1 OP SUSP
OPHTHALMIC | Status: AC
  Filled 2013-12-21: qty 5

## 2013-12-21 MED ORDER — LIDOCAINE HCL 3.5 % OP GEL
OPHTHALMIC | Status: AC
  Filled 2013-12-21: qty 1

## 2013-12-21 MED ORDER — LIDOCAINE HCL (PF) 1 % IJ SOLN
INTRAMUSCULAR | Status: AC
  Filled 2013-12-21: qty 2

## 2013-12-21 MED ORDER — PHENYLEPHRINE HCL 2.5 % OP SOLN
OPHTHALMIC | Status: AC
  Filled 2013-12-21: qty 15

## 2013-12-24 ENCOUNTER — Encounter (HOSPITAL_COMMUNITY): Payer: BC Managed Care – PPO | Admitting: Anesthesiology

## 2013-12-24 ENCOUNTER — Encounter (HOSPITAL_COMMUNITY): Payer: Self-pay | Admitting: Anesthesiology

## 2013-12-24 ENCOUNTER — Ambulatory Visit (HOSPITAL_COMMUNITY)
Admission: RE | Admit: 2013-12-24 | Discharge: 2013-12-24 | Disposition: A | Discharge status: Home / Self Care | Payer: BC Managed Care – PPO | Source: Ambulatory Visit | Attending: Ophthalmology | Admitting: Ophthalmology

## 2013-12-24 ENCOUNTER — Ambulatory Visit (HOSPITAL_COMMUNITY): Payer: BC Managed Care – PPO | Admitting: Anesthesiology

## 2013-12-24 ENCOUNTER — Encounter (HOSPITAL_COMMUNITY)
Admission: RE | Disposition: A | Discharge status: Home / Self Care | Payer: Self-pay | Source: Ambulatory Visit | Attending: Ophthalmology

## 2013-12-24 DIAGNOSIS — H2510 Age-related nuclear cataract, unspecified eye: Secondary | ICD-10-CM | POA: Insufficient documentation

## 2013-12-24 HISTORY — PX: CATARACT EXTRACTION W/PHACO: SHX586

## 2013-12-24 LAB — GLUCOSE, CAPILLARY: GLUCOSE-CAPILLARY: 85 mg/dL (ref 70–99)

## 2013-12-24 SURGERY — PHACOEMULSIFICATION, CATARACT, WITH IOL INSERTION
Anesthesia: Monitor Anesthesia Care | Site: Eye | Laterality: Left

## 2013-12-24 MED ORDER — NEOMYCIN-POLYMYXIN-DEXAMETH 3.5-10000-0.1 OP SUSP
OPHTHALMIC | Status: DC | PRN
  Administered 2013-12-24: 2 [drp] via OPHTHALMIC

## 2013-12-24 MED ORDER — CYCLOPENTOLATE-PHENYLEPHRINE 0.2-1 % OP SOLN
1.0000 [drp] | OPHTHALMIC | Status: AC
  Administered 2013-12-24 (×3): 1 [drp] via OPHTHALMIC

## 2013-12-24 MED ORDER — LIDOCAINE HCL (PF) 1 % IJ SOLN
INTRAMUSCULAR | Status: DC | PRN
  Administered 2013-12-24: .5 mL

## 2013-12-24 MED ORDER — TETRACAINE HCL 0.5 % OP SOLN
1.0000 [drp] | OPHTHALMIC | Status: AC
  Administered 2013-12-24 (×3): 1 [drp] via OPHTHALMIC

## 2013-12-24 MED ORDER — EPINEPHRINE HCL 1 MG/ML IJ SOLN
INTRAOCULAR | Status: DC | PRN
  Administered 2013-12-24: 10:00:00

## 2013-12-24 MED ORDER — MIDAZOLAM HCL 2 MG/2ML IJ SOLN
INTRAMUSCULAR | Status: AC
  Filled 2013-12-24: qty 2

## 2013-12-24 MED ORDER — NA HYALUR & NA CHOND-NA HYALUR 0.55-0.5 ML IO KIT
PACK | INTRAOCULAR | Status: DC | PRN
  Administered 2013-12-24: 1 via OPHTHALMIC

## 2013-12-24 MED ORDER — LIDOCAINE 3.5 % OP GEL OPTIME - NO CHARGE
OPHTHALMIC | Status: DC | PRN
  Administered 2013-12-24: 2 [drp] via OPHTHALMIC

## 2013-12-24 MED ORDER — FENTANYL CITRATE 0.05 MG/ML IJ SOLN
INTRAMUSCULAR | Status: AC
  Filled 2013-12-24: qty 2

## 2013-12-24 MED ORDER — PHENYLEPHRINE HCL 2.5 % OP SOLN
1.0000 [drp] | OPHTHALMIC | Status: AC
  Administered 2013-12-24 (×3): 1 [drp] via OPHTHALMIC

## 2013-12-24 MED ORDER — FENTANYL CITRATE 0.05 MG/ML IJ SOLN: 25.0000 ug | INTRAMUSCULAR | Status: DC | PRN

## 2013-12-24 MED ORDER — LACTATED RINGERS IV SOLN
INTRAVENOUS | Status: DC
  Administered 2013-12-24: 09:00:00 via INTRAVENOUS

## 2013-12-24 MED ORDER — ONDANSETRON HCL 4 MG/2ML IJ SOLN: 4.0000 mg | Freq: Once | INTRAMUSCULAR | Status: DC | PRN

## 2013-12-24 MED ORDER — EPINEPHRINE HCL 1 MG/ML IJ SOLN
INTRAMUSCULAR | Status: AC
  Filled 2013-12-24: qty 1

## 2013-12-24 MED ORDER — LACTATED RINGERS IV SOLN
INTRAVENOUS | Status: DC | PRN
  Administered 2013-12-24: 09:00:00 via INTRAVENOUS

## 2013-12-24 MED ORDER — FENTANYL CITRATE 0.05 MG/ML IJ SOLN
25.0000 ug | INTRAMUSCULAR | Status: AC
  Administered 2013-12-24 (×2): 25 ug via INTRAVENOUS

## 2013-12-24 MED ORDER — MIDAZOLAM HCL 2 MG/2ML IJ SOLN
1.0000 mg | INTRAMUSCULAR | Status: DC | PRN
Start: 2013-12-24 — End: 2013-12-24
  Administered 2013-12-24 (×2): 2 mg via INTRAVENOUS
  Filled 2013-12-24: qty 2

## 2013-12-24 MED ORDER — LIDOCAINE HCL 3.5 % OP GEL
1.0000 "application " | Freq: Once | OPHTHALMIC | Status: AC
  Administered 2013-12-24: 1 via OPHTHALMIC

## 2013-12-24 MED ORDER — BSS IO SOLN
INTRAOCULAR | Status: DC | PRN
  Administered 2013-12-24: 15 mL via INTRAOCULAR

## 2013-12-24 MED ORDER — POVIDONE-IODINE 5 % OP SOLN
OPHTHALMIC | Status: DC | PRN
  Administered 2013-12-24: 1 via OPHTHALMIC

## 2013-12-24 SURGICAL SUPPLY — 33 items
CAPSULAR TENSION RING-AMO (OPHTHALMIC RELATED) IMPLANT
CLOTH BEACON ORANGE TIMEOUT ST (SAFETY) ×3 IMPLANT
EYE SHIELD UNIVERSAL CLEAR (GAUZE/BANDAGES/DRESSINGS) ×3 IMPLANT
GLOVE BIO SURGEON STRL SZ 6.5 (GLOVE) IMPLANT
GLOVE BIO SURGEONS STRL SZ 6.5 (GLOVE)
GLOVE BIOGEL PI IND STRL 6.5 (GLOVE) ×1 IMPLANT
GLOVE BIOGEL PI IND STRL 7.0 (GLOVE) ×1 IMPLANT
GLOVE BIOGEL PI IND STRL 7.5 (GLOVE) IMPLANT
GLOVE BIOGEL PI INDICATOR 6.5 (GLOVE) ×2
GLOVE BIOGEL PI INDICATOR 7.0 (GLOVE) ×2
GLOVE BIOGEL PI INDICATOR 7.5 (GLOVE)
GLOVE ECLIPSE 6.5 STRL STRAW (GLOVE) IMPLANT
GLOVE ECLIPSE 7.0 STRL STRAW (GLOVE) IMPLANT
GLOVE ECLIPSE 7.5 STRL STRAW (GLOVE) IMPLANT
GLOVE EXAM NITRILE LRG STRL (GLOVE) IMPLANT
GLOVE EXAM NITRILE MD LF STRL (GLOVE) IMPLANT
GLOVE SKINSENSE NS SZ6.5 (GLOVE)
GLOVE SKINSENSE NS SZ7.0 (GLOVE)
GLOVE SKINSENSE STRL SZ6.5 (GLOVE) IMPLANT
GLOVE SKINSENSE STRL SZ7.0 (GLOVE) IMPLANT
KIT VITRECTOMY (OPHTHALMIC RELATED) IMPLANT
PAD ARMBOARD 7.5X6 YLW CONV (MISCELLANEOUS) ×3 IMPLANT
PROC W NO LENS (INTRAOCULAR LENS)
PROC W SPEC LENS (INTRAOCULAR LENS)
PROCESS W NO LENS (INTRAOCULAR LENS) IMPLANT
PROCESS W SPEC LENS (INTRAOCULAR LENS) IMPLANT
RING MALYGIN (MISCELLANEOUS) IMPLANT
SIGHTPATH CAT PROC W REG LENS (Ophthalmic Related) ×3 IMPLANT
SYR TB 1ML LL NO SAFETY (SYRINGE) ×3 IMPLANT
TAPE SURG TRANSPORE 1 IN (GAUZE/BANDAGES/DRESSINGS) ×1 IMPLANT
TAPE SURGICAL TRANSPORE 1 IN (GAUZE/BANDAGES/DRESSINGS) ×2
VISCOELASTIC ADDITIONAL (OPHTHALMIC RELATED) IMPLANT
WATER STERILE IRR 250ML POUR (IV SOLUTION) ×3 IMPLANT

## 2013-12-24 NOTE — Transfer of Care (Signed)
Immediate Anesthesia Transfer of Care Note  Patient: [name]  Procedure(s) Performed: Procedure(s) with comments: CATARACT EXTRACTION PHACO AND INTRAOCULAR LENS PLACEMENT (IOC) (Left) - CDE:  10.84  Patient Location: Short Stay  Anesthesia Type:MAC  Level of Consciousness: awake, alert , oriented and patient cooperative  Airway & Oxygen Therapy: Patient Spontanous Breathing  Post-op Assessment: Report given to PACU RN and Post -op Vital signs reviewed and stable  Post vital signs: Reviewed and stable  Complications: No apparent anesthesia complications

## 2013-12-24 NOTE — Preoperative (Signed)
Beta Blockers   Reason not to administer Beta Blockers:Not Applicable 

## 2013-12-24 NOTE — Anesthesia Procedure Notes (Signed)
Procedure Name: MAC Date/Time: 12/24/2013 8:27 AM Performed by: Pernell DupreADAMS, Kateria Cutrona A Pre-anesthesia Checklist: Patient identified, Timeout performed, Emergency Drugs available, Suction available and Patient being monitored Oxygen Delivery Method: Nasal cannula

## 2013-12-24 NOTE — Anesthesia Preprocedure Evaluation (Signed)
Anesthesia Evaluation  Patient identified by MRN, date of birth, ID band Patient awake    Reviewed: Allergy & Precautions, H&P , NPO status , Patient's Chart, lab work & pertinent test results  Airway Mallampati: II TM Distance: >3 FB Neck ROM: Full    Dental  (+) Teeth Intact   Pulmonary shortness of breath,  breath sounds clear to auscultation        Cardiovascular - angina+ CAD and + Past MI Rhythm:Regular Rate:Normal     Neuro/Psych PSYCHIATRIC DISORDERS Anxiety Depression Bipolar Disorder    GI/Hepatic negative GI ROS,   Endo/Other    Renal/GU      Musculoskeletal   Abdominal   Peds  Hematology   Anesthesia Other Findings   Reproductive/Obstetrics                           Anesthesia Physical Anesthesia Plan  ASA: II  Anesthesia Plan: MAC   Post-op Pain Management:    Induction: Intravenous  Airway Management Planned: Nasal Cannula  Additional Equipment:   Intra-op Plan:   Post-operative Plan:   Informed Consent: I have reviewed the patients History and Physical, chart, labs and discussed the procedure including the risks, benefits and alternatives for the proposed anesthesia with the patient or authorized representative who has indicated his/her understanding and acceptance.     Plan Discussed with:   Anesthesia Plan Comments:         Anesthesia Quick Evaluation

## 2013-12-24 NOTE — Op Note (Signed)
Date of Admission: 12/24/2013  Date of Surgery: 12/24/2013  Pre-Op Dx: Cataract  Left  Eye  Post-Op Dx: Nuclear Cataract  Left  Eye,  Dx Code 366.16  Surgeon: Gemma PayorKerry Miguel Medal, M.D.  Assistants: None  Anesthesia: Topical with MAC  Indications: Painless, progressive loss of vision with compromise of daily activities.  Surgery: Cataract Extraction with Intraocular lens Implant Left Eye  Discription: The patient had dilating drops and viscous lidocaine placed into the left eye in the pre-op holding area. After transfer to the operating room, a time out was performed. The patient was then prepped and draped. Beginning with a 75 degree blade a paracentesis port was made at the surgeon's 2 o'clock position. The anterior chamber was then filled with 1% non-preserved lidocaine. This was followed by filling the anterior chamber with Viscoat. A 2.34mm keratome blade was used to make a clear corneal incision at the temporal limbus. A bent cystatome needle was used to create a continuous tear capsulotomy. Hydrodissection was performed with balanced salt solution on a Fine canula. The lens nucleus was then removed using the phacoemulsification handpiece. Residual cortex was removed with the I&A handpiece. The anterior chamber and capsular bag were refilled with Provisc. A posterior chamber intraocular lens was placed into the capsular bag with it's injector. The implant was positioned with the Kuglan hook. The Provisc was then removed from the anterior chamber and capsular bag with the I&A handpiece. Stromal hydration of the main incision and paracentesis port was performed with BSS on a Fine canula. The wounds were tested for leak which was negative. The patient tolerated the procedure well. There were no operative complications. The patient was then transferred to the recovery room in stable condition.  Complications: None  Specimen: None  EBL: None  Prosthetic device: B&L enVista, MX60, power 26.0D, SN  [id number].

## 2013-12-24 NOTE — OR Nursing (Signed)
Patient stated she felt like she was going to pass out.   Laid patient flat , she did pass out for about 15 sec. Patient came to within 15 sec.  Dr. Jayme CloudGonzalez in to see patient.  cbg done glucose 85, vitals stable.  O2  At 3 liters inplace via nasal cannula..  . Patient commented that she had accupuncture before and the same thing happened.

## 2013-12-24 NOTE — Discharge Instructions (Signed)
General Anesthesia, Adult °General anesthesia is a sleep-like state of non-feeling produced by medicines (anesthetics). General anesthesia prevents you from being alert and feeling pain during a medical procedure. Your caregiver may recommend general anesthesia if your procedure: °· Is long. °· Is painful or uncomfortable. °· Would be frightening to see or hear. °· Requires you to be still. °· Affects your breathing. °· Causes significant blood loss. °LET YOUR CAREGIVER KNOW ABOUT: °· Allergies to food or medicine. °· Medicines taken, including vitamins, herbs, eyedrops, over-the-counter medicines, and creams. °· Use of steroids (by mouth or creams). °· Previous problems with anesthetics or numbing medicines, including problems experienced by relatives. °· History of bleeding problems or blood clots. °· Previous surgeries and types of anesthetics received. °· Possibility of pregnancy, if this applies. °· Use of cigarettes, alcohol, or illegal drugs. °· Any health condition(s), especially diabetes, sleep apnea, and high blood pressure. °RISKS AND COMPLICATIONS °General anesthesia rarely causes complications. However, if complications do occur, they can be life-threatening. Complications include: °· A lung infection. °· A stroke. °· A heart attack. °· Waking up during the procedure. When this occurs, the patient may be unable to move and communicate that he or she is awake. The patient may feel severe pain. °Older adults and adults with serious medical problems are more likely to have complications than adults who are young and healthy. Some complications can be prevented by answering all of your caregiver's questions thoroughly and by following all pre-procedure instructions. It is important to tell your caregiver if any of the pre-procedure instructions, especially those related to diet, were not followed. Any food or liquid in the stomach can cause problems when you are under general anesthesia. °BEFORE THE  PROCEDURE °· Ask your caregiver if you will have to spend the night at the hospital. If you will not have to spend the night, arrange to have an adult drive you and stay with you for 24-hours. °· Follow your caregiver's instructions if you are taking dietary supplements or medicines. Your caregiver may tell you to stop taking them or to reduce your dosage. °· Do not smoke for as long as possible before your procedure. If possible, stop smoking 3 6 weeks before the procedure. °· Do not take new dietary supplements or medicines within 1 week of your procedure unless your caregiver approves them. °· Do not eat within 8 hours of your procedure or as directed by your caregiver. Drink only clear liquids, such as water, black coffee (without milk or cream), and fruit juices (without pulp). °· Do not drink within 3 hours of your procedure or as directed by your caregiver. °· You may brush your teeth on the morning of the procedure, but make sure to spit out the toothpaste and water when finished. °PROCEDURE  °You will receive anesthetics through a mask, through an intravenous (IV) access tube, or through both. A doctor who specializes in anesthesia (anesthesiologist) or a nurse who specializes in anesthesia (nurse anesthetist) or both will stay with you throughout the procedure to make sure you remain unconscious. He or she will also watch your blood pressure, pulse, and oxygen levels to make sure that the anesthetics do not cause any problems. Once you are asleep, a breathing tube or mask may be used to help you breathe. °AFTER THE PROCEDURE °You will wake up after the procedure is complete. You may be in the room where the procedure was performed or in a recovery area. You may have a sore throat if   a breathing tube was used. You may also feel: °· Dizzy. °· Weak. °· Drowsy. °· Confused. °· Nauseous. °· Cold. °These are all normal responses and can be expected to last for up to 24 hours after the procedure is complete. A  caregiver will tell you when you are ready to go home. This will usually be when you are fully awake and in stable condition. °Document Released: 02/08/2008 Document Revised: 10/18/2012 Document Reviewed: 03/01/2012 °ExitCare® Patient Information ©2014 [employer]. ° °

## 2013-12-24 NOTE — Anesthesia Postprocedure Evaluation (Signed)
  Anesthesia Post-op Note  Patient: [name]  Procedure(s) Performed: Procedure(s) with comments: CATARACT EXTRACTION PHACO AND INTRAOCULAR LENS PLACEMENT (IOC) (Left) - CDE:  10.84  Patient Location: Short Stay  Anesthesia Type:MAC  Level of Consciousness: awake, alert , oriented and patient cooperative  Airway and Oxygen Therapy: Patient Spontanous Breathing  Post-op Pain: none  Post-op Assessment: Post-op Vital signs reviewed and Patient's Cardiovascular Status Stable  Post-op Vital Signs: Reviewed and stable  Complications: No apparent anesthesia complications

## 2013-12-24 NOTE — H&P (Signed)
I have reviewed the H&P, the patient was re-examined, and I have identified no interval changes in medical condition and plan of care since the history and physical of record  

## 2013-12-25 ENCOUNTER — Encounter (HOSPITAL_COMMUNITY): Payer: Self-pay | Admitting: Ophthalmology

## 2013-12-26 ENCOUNTER — Ambulatory Visit (INDEPENDENT_AMBULATORY_CARE_PROVIDER_SITE_OTHER): Payer: BC Managed Care – PPO | Admitting: Nurse Practitioner

## 2013-12-26 ENCOUNTER — Encounter: Payer: Self-pay | Admitting: Nurse Practitioner

## 2013-12-26 VITALS — BP 117/69 | HR 64 | Temp 97.6°F | Ht 62.0 in | Wt 141.0 lb

## 2013-12-26 DIAGNOSIS — F329 Major depressive disorder, single episode, unspecified: Secondary | ICD-10-CM

## 2013-12-26 DIAGNOSIS — F32A Depression, unspecified: Secondary | ICD-10-CM

## 2013-12-26 DIAGNOSIS — F3289 Other specified depressive episodes: Secondary | ICD-10-CM

## 2013-12-26 MED ORDER — FLUOXETINE HCL 90 MG PO CPDR: 90.0000 mg | DELAYED_RELEASE_CAPSULE | ORAL | Status: DC

## 2013-12-26 NOTE — Progress Notes (Signed)
   Subjective:    Patient ID: [name], female    DOB: 1961-10-30, 53 y.o.   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 SHe needs to have him put in nursing facility. SHe does not know where to start. SHe says that he requires a lot of care at night so she gets no sleep.    Review of Systems  Constitutional: Negative.   HENT: Negative.   Respiratory: Negative.   Cardiovascular: Negative.   All other systems reviewed and are negative.       Objective:   Physical Exam  Constitutional: She is oriented to person, place, and time. She appears well-developed and well-nourished.  Cardiovascular: Normal rate, regular rhythm and normal heart sounds.   Pulmonary/Chest: Effort normal and breath sounds normal.  Abdominal: Soft.  Neurological: She is alert and oriented to person, place, and time.  Skin: Skin is warm.  Psychiatric: She has a normal mood and affect. Her behavior is normal. Judgment and thought content normal.  Tearful at times   BP 117/69  Pulse 64  Temp(Src) 97.6 F (36.4 C) (Oral)  Ht 5\' 2"  (1.575 m)  Wt 141 lb (63.957 kg)  BMI 25.78 kg/m2        Assessment & Plan:   1. Depression    Meds ordered this encounter  Medications  . BESIVANCE 0.6 % SUSP    Sig:   . PROLENSA 0.07 % SOLN    Sig:   . DUREZOL 0.05 % EMUL    Sig:   . DISCONTD: FLUoxetine (PROZAC) 20 MG capsule    Sig: Take 20 mg by mouth daily.  Marland Kitchen. FLUoxetine (PROZAC WEEKLY) 90 MG DR capsule    Sig: Take 1 capsule (90 mg total) by mouth every 7 (seven) days.    Dispense:  12 capsule    Refill:  3    Order Specific Question:  Supervising Provider    Answer:  Ernestina PennaMOORE, DONALD W [[id number]]   Stress management Diet and exercise  [name] Daphine DeutscherMartin, FNP

## 2013-12-26 NOTE — Patient Instructions (Signed)
Stress Management Stress is a state of physical or mental tension that often results from changes in your life or normal routine. Some common causes of stress are:  Death of a loved one.  Injuries or severe illnesses.  Getting fired or changing jobs.  Moving into a new home. Other causes may be:  Sexual problems.  Business or financial losses.  Taking on a large debt.  Regular conflict with someone at home or at work.  Constant tiredness from lack of sleep. It is not just bad things that are stressful. It may be stressful to:  Win the lottery.  Get married.  Buy a new car. The amount of stress that can be easily tolerated varies from person to person. Changes generally cause stress, regardless of the types of change. Too much stress can affect your health. It may lead to physical or emotional problems. Too little stress (boredom) may also become stressful. SUGGESTIONS TO REDUCE STRESS:  Talk things over with your family and friends. It often is helpful to share your concerns and worries. If you feel your problem is serious, you may want to get help from a professional counselor.  Consider your problems one at a time instead of lumping them all together. Trying to take care of everything at once may seem impossible. List all the things you need to do and then start with the most important one. Set a goal to accomplish 2 or 3 things each day. If you expect to do too many in a single day you will naturally fail, causing you to feel even more stressed.  Do not use alcohol or drugs to relieve stress. Although you may feel better for a short time, they do not remove the problems that caused the stress. They can also be habit forming.  Exercise regularly - at least 3 times per week. Physical exercise can help to relieve that "uptight" feeling and will relax you.  The shortest distance between despair and hope is often a good night's sleep.  Go to bed and get up on time allowing  yourself time for appointments without being rushed.  Take a short "time-out" period from any stressful situation that occurs during the day. Close your eyes and take some deep breaths. Starting with the muscles in your face, tense them, hold it for a few seconds, then relax. Repeat this with the muscles in your neck, shoulders, hand, stomach, back and legs.  Take good care of yourself. Eat a balanced diet and get plenty of rest.  Schedule time for having fun. Take a break from your daily routine to relax. HOME CARE INSTRUCTIONS   Call if you feel overwhelmed by your problems and feel you can no longer manage them on your own.  Return immediately if you feel like hurting yourself or someone else. Document Released: 04/27/2001 Document Revised: 01/24/2012 Document Reviewed: 06/26/2013 ExitCare Patient Information 2014 [employer].  

## 2013-12-31 ENCOUNTER — Encounter (HOSPITAL_COMMUNITY): Payer: Self-pay | Admitting: Pharmacy Technician

## 2014-01-02 ENCOUNTER — Encounter (HOSPITAL_COMMUNITY)
Admission: RE | Admit: 2014-01-02 | Discharge: 2014-01-02 | Disposition: A | Discharge status: Home / Self Care | Payer: BC Managed Care – PPO | Source: Ambulatory Visit | Attending: Ophthalmology | Admitting: Ophthalmology

## 2014-01-02 ENCOUNTER — Encounter (HOSPITAL_COMMUNITY): Payer: Self-pay

## 2014-01-04 MED ORDER — PHENYLEPHRINE HCL 2.5 % OP SOLN
OPHTHALMIC | Status: AC
  Filled 2014-01-04: qty 15

## 2014-01-04 MED ORDER — CYCLOPENTOLATE-PHENYLEPHRINE OP SOLN OPTIME - NO CHARGE
OPHTHALMIC | Status: AC
  Filled 2014-01-04: qty 2

## 2014-01-04 MED ORDER — NEOMYCIN-POLYMYXIN-DEXAMETH 3.5-10000-0.1 OP SUSP
OPHTHALMIC | Status: AC
  Filled 2014-01-04: qty 5

## 2014-01-04 MED ORDER — LIDOCAINE HCL (PF) 1 % IJ SOLN
INTRAMUSCULAR | Status: AC
  Filled 2014-01-04: qty 2

## 2014-01-04 MED ORDER — TETRACAINE HCL 0.5 % OP SOLN
OPHTHALMIC | Status: AC
  Filled 2014-01-04: qty 2

## 2014-01-04 MED ORDER — LIDOCAINE HCL 3.5 % OP GEL
OPHTHALMIC | Status: AC
  Filled 2014-01-04: qty 1

## 2014-01-07 ENCOUNTER — Encounter (HOSPITAL_COMMUNITY): Payer: BC Managed Care – PPO | Admitting: Anesthesiology

## 2014-01-07 ENCOUNTER — Encounter (HOSPITAL_COMMUNITY)
Admission: RE | Disposition: A | Discharge status: Home Health | Payer: Self-pay | Source: Ambulatory Visit | Attending: Ophthalmology

## 2014-01-07 ENCOUNTER — Ambulatory Visit (HOSPITAL_COMMUNITY): Payer: BC Managed Care – PPO | Admitting: Anesthesiology

## 2014-01-07 ENCOUNTER — Encounter (HOSPITAL_COMMUNITY): Payer: Self-pay

## 2014-01-07 ENCOUNTER — Ambulatory Visit (HOSPITAL_COMMUNITY)
Admission: RE | Admit: 2014-01-07 | Discharge: 2014-01-07 | Disposition: A | Discharge status: Home Health | Payer: BC Managed Care – PPO | Source: Ambulatory Visit | Attending: Ophthalmology | Admitting: Ophthalmology

## 2014-01-07 DIAGNOSIS — H2510 Age-related nuclear cataract, unspecified eye: Secondary | ICD-10-CM | POA: Insufficient documentation

## 2014-01-07 DIAGNOSIS — F411 Generalized anxiety disorder: Secondary | ICD-10-CM | POA: Insufficient documentation

## 2014-01-07 HISTORY — PX: CATARACT EXTRACTION W/PHACO: SHX586

## 2014-01-07 SURGERY — PHACOEMULSIFICATION, CATARACT, WITH IOL INSERTION
Anesthesia: Monitor Anesthesia Care | Site: Eye | Laterality: Right

## 2014-01-07 MED ORDER — EPINEPHRINE HCL 1 MG/ML IJ SOLN
INTRAMUSCULAR | Status: AC
  Filled 2014-01-07: qty 1

## 2014-01-07 MED ORDER — BSS IO SOLN
INTRAOCULAR | Status: DC | PRN
  Administered 2014-01-07: 15 mL via INTRAOCULAR

## 2014-01-07 MED ORDER — LIDOCAINE 3.5 % OP GEL OPTIME - NO CHARGE
OPHTHALMIC | Status: DC | PRN
  Administered 2014-01-07: 1 [drp] via OPHTHALMIC

## 2014-01-07 MED ORDER — MIDAZOLAM HCL 2 MG/2ML IJ SOLN
1.0000 mg | INTRAMUSCULAR | Status: DC | PRN
  Administered 2014-01-07: 2 mg via INTRAVENOUS

## 2014-01-07 MED ORDER — LIDOCAINE HCL (PF) 1 % IJ SOLN
INTRAMUSCULAR | Status: DC | PRN
  Administered 2014-01-07: .5 mL

## 2014-01-07 MED ORDER — TETRACAINE HCL 0.5 % OP SOLN
1.0000 [drp] | OPHTHALMIC | Status: AC
  Administered 2014-01-07 (×3): 1 [drp] via OPHTHALMIC

## 2014-01-07 MED ORDER — FENTANYL CITRATE 0.05 MG/ML IJ SOLN
INTRAMUSCULAR | Status: AC
  Filled 2014-01-07: qty 2

## 2014-01-07 MED ORDER — MIDAZOLAM HCL 2 MG/2ML IJ SOLN
INTRAMUSCULAR | Status: AC
  Filled 2014-01-07: qty 2

## 2014-01-07 MED ORDER — LACTATED RINGERS IV SOLN
INTRAVENOUS | Status: DC
  Administered 2014-01-07: 09:00:00 via INTRAVENOUS

## 2014-01-07 MED ORDER — POVIDONE-IODINE 5 % OP SOLN
OPHTHALMIC | Status: DC | PRN
  Administered 2014-01-07: 1 via OPHTHALMIC

## 2014-01-07 MED ORDER — LIDOCAINE HCL 3.5 % OP GEL: 1.0000 "application " | Freq: Once | OPHTHALMIC | Status: DC

## 2014-01-07 MED ORDER — CYCLOPENTOLATE-PHENYLEPHRINE 0.2-1 % OP SOLN
1.0000 [drp] | OPHTHALMIC | Status: AC
  Administered 2014-01-07 (×3): 1 [drp] via OPHTHALMIC

## 2014-01-07 MED ORDER — NEOMYCIN-POLYMYXIN-DEXAMETH 3.5-10000-0.1 OP SUSP
OPHTHALMIC | Status: DC | PRN
  Administered 2014-01-07: 2 [drp] via OPHTHALMIC

## 2014-01-07 MED ORDER — PHENYLEPHRINE HCL 2.5 % OP SOLN
1.0000 [drp] | OPHTHALMIC | Status: AC
  Administered 2014-01-07 (×3): 1 [drp] via OPHTHALMIC

## 2014-01-07 MED ORDER — FENTANYL CITRATE 0.05 MG/ML IJ SOLN
25.0000 ug | INTRAMUSCULAR | Status: AC
  Administered 2014-01-07 (×2): 25 ug via INTRAVENOUS

## 2014-01-07 MED ORDER — EPINEPHRINE HCL 1 MG/ML IJ SOLN
INTRAOCULAR | Status: DC | PRN
  Administered 2014-01-07: 10:00:00

## 2014-01-07 MED ORDER — PROVISC 10 MG/ML IO SOLN
INTRAOCULAR | Status: DC | PRN
  Administered 2014-01-07: 0.85 mL via INTRAOCULAR

## 2014-01-07 SURGICAL SUPPLY — 33 items
CAPSULAR TENSION RING-AMO (OPHTHALMIC RELATED) IMPLANT
CLOTH BEACON ORANGE TIMEOUT ST (SAFETY) ×3 IMPLANT
EYE SHIELD UNIVERSAL CLEAR (GAUZE/BANDAGES/DRESSINGS) ×3 IMPLANT
GLOVE BIO SURGEON STRL SZ 6.5 (GLOVE) IMPLANT
GLOVE BIO SURGEONS STRL SZ 6.5 (GLOVE)
GLOVE BIOGEL PI IND STRL 6.5 (GLOVE) ×1 IMPLANT
GLOVE BIOGEL PI IND STRL 7.0 (GLOVE) IMPLANT
GLOVE BIOGEL PI IND STRL 7.5 (GLOVE) IMPLANT
GLOVE BIOGEL PI INDICATOR 6.5 (GLOVE) ×2
GLOVE BIOGEL PI INDICATOR 7.0 (GLOVE)
GLOVE BIOGEL PI INDICATOR 7.5 (GLOVE)
GLOVE ECLIPSE 6.5 STRL STRAW (GLOVE) IMPLANT
GLOVE ECLIPSE 7.0 STRL STRAW (GLOVE) IMPLANT
GLOVE ECLIPSE 7.5 STRL STRAW (GLOVE) IMPLANT
GLOVE EXAM NITRILE LRG STRL (GLOVE) IMPLANT
GLOVE EXAM NITRILE MD LF STRL (GLOVE) ×3 IMPLANT
GLOVE SKINSENSE NS SZ6.5 (GLOVE)
GLOVE SKINSENSE NS SZ7.0 (GLOVE)
GLOVE SKINSENSE STRL SZ6.5 (GLOVE) IMPLANT
GLOVE SKINSENSE STRL SZ7.0 (GLOVE) IMPLANT
KIT VITRECTOMY (OPHTHALMIC RELATED) IMPLANT
PAD ARMBOARD 7.5X6 YLW CONV (MISCELLANEOUS) ×3 IMPLANT
PROC W NO LENS (INTRAOCULAR LENS)
PROC W SPEC LENS (INTRAOCULAR LENS)
PROCESS W NO LENS (INTRAOCULAR LENS) IMPLANT
PROCESS W SPEC LENS (INTRAOCULAR LENS) IMPLANT
RING MALYGIN (MISCELLANEOUS) IMPLANT
SIGHTPATH CAT PROC W REG LENS (Ophthalmic Related) ×3 IMPLANT
SYR TB 1ML LL NO SAFETY (SYRINGE) ×3 IMPLANT
TAPE SURG TRANSPORE 1 IN (GAUZE/BANDAGES/DRESSINGS) ×1 IMPLANT
TAPE SURGICAL TRANSPORE 1 IN (GAUZE/BANDAGES/DRESSINGS) ×2
VISCOELASTIC ADDITIONAL (OPHTHALMIC RELATED) IMPLANT
WATER STERILE IRR 250ML POUR (IV SOLUTION) ×3 IMPLANT

## 2014-01-07 NOTE — Op Note (Signed)
Date of Admission: 01/07/2014  Date of Surgery: 01/07/2014  Pre-Op Dx: Cataract  Right  Eye  Post-Op Dx: Nuclear Cataract  Right  Eye,  Dx Code 366.16  Surgeon: Gemma PayorKerry Dinorah Masullo, M.D.  Assistants: None  Anesthesia: Topical with MAC  Indications: Painless, progressive loss of vision with compromise of daily activities.  Surgery: Cataract Extraction with Intraocular lens Implant Right Eye  Discription: The patient had dilating drops and viscous lidocaine placed into the right eye in the pre-op holding area. After transfer to the operating room, a time out was performed. The patient was then prepped and draped. Beginning with a 75 degree blade a paracentesis port was made at the surgeon's 2 o'clock position. The anterior chamber was then filled with 1% non-preserved lidocaine. This was followed by filling the anterior chamber with Provisc.  A 2.324mm keratome blade was used to make a clear corneal incision at the temporal limbus.  A bent cystatome needle was used to create a continuous tear capsulotomy. Hydrodissection was performed with balanced salt solution on a Fine canula. The lens nucleus was then removed using the phacoemulsification handpiece. Residual cortex was removed with the I&A handpiece. The anterior chamber and capsular bag were refilled with Provisc. A posterior chamber intraocular lens was placed into the capsular bag with it's injector. The implant was positioned with the Kuglan hook. The Provisc was then removed from the anterior chamber and capsular bag with the I&A handpiece. Stromal hydration of the main incision and paracentesis port was performed with BSS on a Fine canula. The wounds were tested for leak which was negative. The patient tolerated the procedure well. There were no operative complications. The patient was then transferred to the recovery room in stable condition.  Complications: None  Specimen: None  EBL: None  Prosthetic device: B&L enVista, MX60, power 26.5D, SN  [id number].

## 2014-01-07 NOTE — Anesthesia Postprocedure Evaluation (Signed)
  Anesthesia Post-op Note  Patient: [name]  Procedure(s) Performed: Procedure(s) with comments: CATARACT EXTRACTION PHACO AND INTRAOCULAR LENS PLACEMENT (IOC) (Right) - CDE:7.76  Patient Location: Short Stay  Anesthesia Type:MAC  Level of Consciousness: awake, alert  and oriented  Airway and Oxygen Therapy: Patient Spontanous Breathing  Post-op Pain: none  Post-op Assessment: Post-op Vital signs reviewed, Patient's Cardiovascular Status Stable, Respiratory Function Stable, Patent Airway and No signs of Nausea or vomiting  Post-op Vital Signs: Reviewed and stable  Complications: No apparent anesthesia complications

## 2014-01-07 NOTE — H&P (Signed)
I have reviewed the H&P, the patient was re-examined, and I have identified no interval changes in medical condition and plan of care since the history and physical of record  

## 2014-01-07 NOTE — Transfer of Care (Signed)
Immediate Anesthesia Transfer of Care Note  Patient: [name]  Procedure(s) Performed: Procedure(s) with comments: CATARACT EXTRACTION PHACO AND INTRAOCULAR LENS PLACEMENT (IOC) (Right) - CDE:7.76  Patient Location: Short Stay  Anesthesia Type:MAC  Level of Consciousness: awake  Airway & Oxygen Therapy: Patient Spontanous Breathing  Post-op Assessment: Report given to PACU RN  Post vital signs: Reviewed  Complications: No apparent anesthesia complications

## 2014-01-07 NOTE — Anesthesia Preprocedure Evaluation (Signed)
Anesthesia Evaluation  Patient identified by MRN, date of birth, ID band Patient awake    Reviewed: Allergy & Precautions, H&P , NPO status , Patient's Chart, lab work & pertinent test results  Airway Mallampati: II TM Distance: >3 FB Neck ROM: Full    Dental  (+) Teeth Intact   Pulmonary shortness of breath,  breath sounds clear to auscultation        Cardiovascular - angina+ CAD and + Past MI Rhythm:Regular Rate:Normal     Neuro/Psych PSYCHIATRIC DISORDERS Anxiety Depression Bipolar Disorder    GI/Hepatic negative GI ROS,   Endo/Other    Renal/GU      Musculoskeletal   Abdominal   Peds  Hematology   Anesthesia Other Findings   Reproductive/Obstetrics                           Anesthesia Physical Anesthesia Plan  ASA: III  Anesthesia Plan: MAC   Post-op Pain Management:    Induction: Intravenous  Airway Management Planned: Nasal Cannula  Additional Equipment:   Intra-op Plan:   Post-operative Plan:   Informed Consent: I have reviewed the patients History and Physical, chart, labs and discussed the procedure including the risks, benefits and alternatives for the proposed anesthesia with the patient or authorized representative who has indicated his/her understanding and acceptance.     Plan Discussed with:   Anesthesia Plan Comments:         Anesthesia Quick Evaluation

## 2014-01-07 NOTE — Discharge Instructions (Signed)
Post op appointment today at 1200 pm  PATIENT INSTRUCTIONS POST-ANESTHESIA  IMMEDIATELY FOLLOWING SURGERY:  Do not drive or operate machinery for the first twenty four hours after surgery.  Do not make any important decisions for twenty four hours after surgery or while taking narcotic pain medications or sedatives.  If you develop intractable nausea and vomiting or a severe headache please notify your doctor immediately.  FOLLOW-UP:  Please make an appointment with your surgeon as instructed. You do not need to follow up with anesthesia unless specifically instructed to do so.  WOUND CARE INSTRUCTIONS (if applicable):  Keep a dry clean dressing on the anesthesia/puncture wound site if there is drainage.  Once the wound has quit draining you may leave it open to air.  Generally you should leave the bandage intact for twenty four hours unless there is drainage.  If the epidural site drains for more than 36-48 hours please call the anesthesia department.  QUESTIONS?:  Please feel free to call your physician or the hospital operator if you have any questions, and they will be happy to assist you.

## 2014-01-08 ENCOUNTER — Encounter (HOSPITAL_COMMUNITY): Payer: Self-pay | Admitting: Ophthalmology

## 2014-03-18 ENCOUNTER — Telehealth: Payer: Self-pay | Admitting: Nurse Practitioner

## 2014-03-18 NOTE — Telephone Encounter (Signed)
[facility] Patient advised if she has any chest pain, sob to get straight to the ER. Patient aware and understands

## 2014-03-19 ENCOUNTER — Encounter: Payer: Self-pay | Admitting: Family Medicine

## 2014-03-19 ENCOUNTER — Ambulatory Visit (INDEPENDENT_AMBULATORY_CARE_PROVIDER_SITE_OTHER): Payer: BC Managed Care – PPO | Admitting: Nurse Practitioner

## 2014-03-19 ENCOUNTER — Encounter: Payer: Self-pay | Admitting: Nurse Practitioner

## 2014-03-19 VITALS — BP 127/71 | HR 79 | Temp 98.4°F | Ht 62.0 in | Wt 145.0 lb

## 2014-03-19 DIAGNOSIS — S139XXA Sprain of joints and ligaments of unspecified parts of neck, initial encounter: Secondary | ICD-10-CM

## 2014-03-19 DIAGNOSIS — M79609 Pain in unspecified limb: Secondary | ICD-10-CM

## 2014-03-19 DIAGNOSIS — Z8349 Family history of other endocrine, nutritional and metabolic diseases: Secondary | ICD-10-CM

## 2014-03-19 DIAGNOSIS — S161XXA Strain of muscle, fascia and tendon at neck level, initial encounter: Secondary | ICD-10-CM

## 2014-03-19 DIAGNOSIS — M79602 Pain in left arm: Secondary | ICD-10-CM

## 2014-03-19 MED ORDER — CYCLOBENZAPRINE HCL 5 MG PO TABS: 5.0000 mg | ORAL_TABLET | Freq: Three times a day (TID) | ORAL | Status: DC | PRN

## 2014-03-19 MED ORDER — MELOXICAM 15 MG PO TABS: 15.0000 mg | ORAL_TABLET | Freq: Every day | ORAL | Status: DC

## 2014-03-19 NOTE — Progress Notes (Signed)
   Subjective:    Patient ID: [name], female    DOB: [birth year], 53 y.o.   MRN: [id number]  HPI Patient in today c/o of left arm pain and tingling. Started a couple of days ago- intermittent- Her husband is dying of liver and pancreatic cancer ansd she is under a lot of stress- message helps. Denies ant SOB or chest pain- No DOE.    Review of Systems  Constitutional: Negative.   HENT: Negative.   Respiratory: Negative.   Cardiovascular: Negative.   Genitourinary: Negative.   Neurological: Negative.   All other systems reviewed and are negative.      Objective:   Physical Exam  Constitutional: She is oriented to person, place, and time. She appears well-developed and well-nourished.  Cardiovascular: Normal rate, regular rhythm and normal heart sounds.   Pulmonary/Chest: Effort normal and breath sounds normal.  Neurological: She is alert and oriented to person, place, and time.  Skin: Skin is warm and dry.  Psychiatric: She has a normal mood and affect. Her behavior is normal. Judgment and thought content normal.   BP 127/71  Pulse 79  Temp(Src) 98.4 F (36.9 C) (Oral)  Ht 5\' 2"  (1.575 m)  Wt 145 lb (65.772 kg)  BMI 26.51 kg/m2  EKG- Brendolyn PattyNSR-[name] Shelton Soler, FNP       Assessment & Plan:   1. Arm pain, left   2. Neck strain    Meds ordered this encounter  Medications  . meloxicam (MOBIC) 15 MG tablet    Sig: Take 1 tablet (15 mg total) by mouth daily.    Dispense:  30 tablet    Refill:  3    Order Specific Question:  Supervising Provider    Answer:  Ernestina PennaMOORE, DONALD W [[id number]]  . cyclobenzaprine (FLEXERIL) 5 MG tablet    Sig: Take 1 tablet (5 mg total) by mouth 3 (three) times daily as needed for muscle spasms.    Dispense:  30 tablet    Refill:  1    Order Specific Question:  Supervising Provider    Answer:  Ernestina PennaMOORE, DONALD W [[id number]]   Moist heat Rest Stress management  [name] Daphine DeutscherMartin, FNP

## 2014-03-20 LAB — CMP14+EGFR
ALT: 22 IU/L (ref 0–32)
AST: 25 IU/L (ref 0–40)
Albumin/Globulin Ratio: 1.7 (ref 1.1–2.5)
Albumin: 4.1 g/dL (ref 3.5–5.5)
Alkaline Phosphatase: 69 IU/L (ref 39–117)
BUN / CREAT RATIO: 20 (ref 9–23)
BUN: 17 mg/dL (ref 6–24)
CALCIUM: 9.2 mg/dL (ref 8.7–10.2)
CHLORIDE: 103 mmol/L (ref 97–108)
CO2: 25 mmol/L (ref 18–29)
CREATININE: 0.86 mg/dL (ref 0.57–1.00)
GFR calc Af Amer: 89 mL/min/{1.73_m2} (ref >59)
GFR calc non Af Amer: 77 mL/min/{1.73_m2} (ref >59)
Globulin, Total: 2.4 g/dL (ref 1.5–4.5)
Glucose: 97 mg/dL (ref 65–99)
POTASSIUM: 4.1 mmol/L (ref 3.5–5.2)
Sodium: 142 mmol/L (ref 134–144)
Total Bilirubin: 0.3 mg/dL (ref 0.0–1.2)
Total Protein: 6.5 g/dL (ref 6.0–8.5)

## 2014-03-20 LAB — THYROID PANEL WITH TSH
Free Thyroxine Index: 2.3 (ref 1.2–4.9)
T3 UPTAKE RATIO: 30 % (ref 24–39)
T4 TOTAL: 7.7 ug/dL (ref 4.5–12.0)
TSH: 1.45 u[IU]/mL (ref 0.450–4.500)

## 2014-04-22 ENCOUNTER — Ambulatory Visit (HOSPITAL_COMMUNITY): Payer: BC Managed Care – PPO

## 2014-06-01 ENCOUNTER — Ambulatory Visit (INDEPENDENT_AMBULATORY_CARE_PROVIDER_SITE_OTHER): Payer: BC Managed Care – PPO | Admitting: General Practice

## 2014-06-01 ENCOUNTER — Encounter: Payer: Self-pay | Admitting: General Practice

## 2014-06-01 VITALS — BP 116/68 | HR 62 | Temp 97.0°F | Ht 62.0 in | Wt 142.8 lb

## 2014-06-01 DIAGNOSIS — M7712 Lateral epicondylitis, left elbow: Secondary | ICD-10-CM

## 2014-06-01 DIAGNOSIS — M7710 Lateral epicondylitis, unspecified elbow: Secondary | ICD-10-CM

## 2014-06-01 NOTE — Progress Notes (Signed)
   Subjective:    Patient ID: [name], female    DOB: 01-13-1961, 53 y.o.   MRN: [id number]  HPI  Patient presents today with complaints of left elbow. Onset of pain was 1-1.5 weeks ago and gradually worsening. [name] reports pain is worse with extension of left arm, grasping items, or making a fist. She denies any known specific injury, but participates in yoga (which she states may have caused injury). Patient rates pain at 5 of 10 and relieved by resting left arm close to body. Denies taking OTC medications.    Review of Systems  Constitutional: Negative for fever and chills.  Respiratory: Negative for chest tightness and shortness of breath.   Cardiovascular: Negative for chest pain and palpitations.  Musculoskeletal:       Left elbow pain  Skin: Negative for rash.       Objective:   Physical Exam  Constitutional: She is oriented to person, place, and time. She appears well-developed and well-nourished.  Cardiovascular: Normal rate, regular rhythm and normal heart sounds.   Pulmonary/Chest: Effort normal and breath sounds normal. No respiratory distress. She exhibits no tenderness.  Musculoskeletal: She exhibits tenderness.  Left lateral elbow tenderness upon palpation. Pain noted with extension of left arm, extension of fingers, and  forearm muscle tenderness. Negative edema or discoloration. Capillary refill less than 3 seconds.   Neurological: She is alert and oriented to person, place, and time.  Skin: Skin is warm and dry.  Psychiatric: She has a normal mood and affect.          Assessment & Plan:  1. Tennis elbow, left -Left elbow brace (wear during day) -Rest affected extremity -avoid activities that cause pain -NSAIDs as directed  -RTO if symptoms worsen or unresolved in 2 weeks -Patient verbalized understanding Coralie KeensMae E. Johnanthony Wilden, FNP-C

## 2014-06-01 NOTE — Patient Instructions (Signed)
Tennis Elbow Your caregiver has diagnosed you with a condition often referred to as "tennis elbow." This results from small tears or soreness (inflammation) at the start (origin) of the extensor muscles of the forearm. Although the condition is often called tennis or golfer's elbow, it is caused by any repetitive action performed by your elbow. HOME CARE INSTRUCTIONS  If the condition has been short lived, rest may be the only treatment required. Using your opposite hand or arm to perform the task may help. Even changing your grip may help rest the extremity. These may even prevent the condition from recurring.  Longer standing problems, however, will often be relieved faster by:  Using anti-inflammatory agents.  Applying ice packs for 30 minutes at the end of the working day, at bed time, or when activities are finished.  Your caregiver may also have you wear a splint or sling. This will allow the inflamed tendon to heal. At times, steroid injections aided with a local anesthetic will be required along with splinting for 1 to 2 weeks. Two to three steroid injections will often solve the problem. In some long standing cases, the inflamed tendon does not respond to conservative (non-surgical) therapy. Then surgery may be required to repair it. MAKE SURE YOU:   Understand these instructions.  Will watch your condition.  Will get help right away if you are not doing well or get worse. Document Released: 11/01/2005 Document Revised: 01/24/2012 Document Reviewed: 06/19/2008 ExitCare Patient Information 2015 [employer]. This information is not intended to replace advice given to you by your health care provider. Make sure you discuss any questions you have with your health care provider.  

## 2014-06-12 ENCOUNTER — Other Ambulatory Visit: Payer: Self-pay | Admitting: Family Medicine

## 2014-06-12 DIAGNOSIS — Z1231 Encounter for screening mammogram for malignant neoplasm of breast: Secondary | ICD-10-CM

## 2014-06-17 ENCOUNTER — Ambulatory Visit (HOSPITAL_COMMUNITY)
Admission: RE | Admit: 2014-06-17 | Discharge: 2014-06-17 | Disposition: A | Discharge status: Home / Self Care | Payer: BC Managed Care – PPO | Source: Ambulatory Visit | Attending: Family Medicine | Admitting: Family Medicine

## 2014-06-17 DIAGNOSIS — Z1231 Encounter for screening mammogram for malignant neoplasm of breast: Secondary | ICD-10-CM | POA: Insufficient documentation

## 2014-06-20 ENCOUNTER — Encounter: Payer: Self-pay | Admitting: Nurse Practitioner

## 2014-06-20 ENCOUNTER — Ambulatory Visit (INDEPENDENT_AMBULATORY_CARE_PROVIDER_SITE_OTHER): Payer: BC Managed Care – PPO | Admitting: Nurse Practitioner

## 2014-06-20 ENCOUNTER — Telehealth: Payer: Self-pay | Admitting: Nurse Practitioner

## 2014-06-20 ENCOUNTER — Ambulatory Visit (INDEPENDENT_AMBULATORY_CARE_PROVIDER_SITE_OTHER): Payer: BC Managed Care – PPO

## 2014-06-20 VITALS — BP 99/67 | HR 76 | Temp 97.2°F | Ht 62.0 in | Wt 144.5 lb

## 2014-06-20 DIAGNOSIS — M7712 Lateral epicondylitis, left elbow: Secondary | ICD-10-CM

## 2014-06-20 DIAGNOSIS — M25522 Pain in left elbow: Secondary | ICD-10-CM

## 2014-06-20 DIAGNOSIS — M25529 Pain in unspecified elbow: Secondary | ICD-10-CM

## 2014-06-20 DIAGNOSIS — M7710 Lateral epicondylitis, unspecified elbow: Secondary | ICD-10-CM

## 2014-06-20 MED ORDER — PREDNISONE (PAK) 10 MG PO TABS: ORAL_TABLET | Freq: Every day | ORAL | Status: DC

## 2014-06-20 NOTE — Progress Notes (Signed)
   Subjective:    Patient ID: Ariel BothLydia P Bryant, female    DOB: [birth year], 53 y.o.   MRN: [id number]  HPI Patient in c/o left elbow pain- was seen 2 saturdays ago and was diagnosed with tennis elbow.  WAs given tennis elbow strap and told to take  NSAIDS. SHe says that the pain is getting worse.    Review of Systems  Constitutional: Negative.   HENT: Negative.   Respiratory: Negative.   Cardiovascular: Negative.   Genitourinary: Negative.   Musculoskeletal: Positive for joint swelling.  Neurological: Negative.   Psychiatric/Behavioral: Negative.   All other systems reviewed and are negative.      Objective:   Physical Exam  Constitutional: She is oriented to person, place, and time. She appears well-developed and well-nourished.  Cardiovascular: Normal rate, regular rhythm and normal heart sounds.   Pulmonary/Chest: Effort normal and breath sounds normal.  Musculoskeletal:  Pain lateral epicondyle of left elbow Pain with gripping  Neurological: She is alert and oriented to person, place, and time.  Skin: Skin is warm and dry.  Psychiatric: She has a normal mood and affect. Her behavior is normal. Judgment and thought content normal.   BP 99/67  Pulse 76  Temp(Src) 97.2 F (36.2 C) (Oral)  Ht 5\' 2"  (1.575 m)  Wt 144 lb 8 oz (65.545 kg)  BMI 26.42 kg/m2  Elbow xray- normal-Preliminary reading by Paulene FloorMary Georgina Krist, FNP  [facility]        Assessment & Plan:   1. Elbow pain, left   2. Tennis elbow, left    Wear brace as much as possible even when sleepoing Meds ordered this encounter  Medications  . predniSONE (STERAPRED UNI-PAK) 10 MG tablet    Sig: Take by mouth daily. As directed    Dispense:  21 tablet    Refill:  0    Order Specific Question:  Supervising Provider    Answer:  Ernestina PennaMOORE, DONALD W [[id number]]  rest arm when can RTO prn  [name] Daphine DeutscherMartin, FNP

## 2014-06-20 NOTE — Telephone Encounter (Signed)
Appt given

## 2014-06-20 NOTE — Patient Instructions (Signed)
Tennis Elbow Your caregiver has diagnosed you with a condition often referred to as "tennis elbow." This results from small tears or soreness (inflammation) at the start (origin) of the extensor muscles of the forearm. Although the condition is often called tennis or golfer's elbow, it is caused by any repetitive action performed by your elbow. HOME CARE INSTRUCTIONS  If the condition has been short lived, rest may be the only treatment required. Using your opposite hand or arm to perform the task may help. Even changing your grip may help rest the extremity. These may even prevent the condition from recurring.  Longer standing problems, however, will often be relieved faster by:  Using anti-inflammatory agents.  Applying ice packs for 30 minutes at the end of the working day, at bed time, or when activities are finished.  Your caregiver may also have you wear a splint or sling. This will allow the inflamed tendon to heal. At times, steroid injections aided with a local anesthetic will be required along with splinting for 1 to 2 weeks. Two to three steroid injections will often solve the problem. In some long standing cases, the inflamed tendon does not respond to conservative (non-surgical) therapy. Then surgery may be required to repair it. MAKE SURE YOU:   Understand these instructions.  Will watch your condition.  Will get help right away if you are not doing well or get worse. Document Released: 11/01/2005 Document Revised: 01/24/2012 Document Reviewed: 06/19/2008 ExitCare Patient Information 2015 [employer]. This information is not intended to replace advice given to you by your health care provider. Make sure you discuss any questions you have with your health care provider.  

## 2014-06-24 ENCOUNTER — Ambulatory Visit: Payer: BC Managed Care – PPO | Admitting: Nurse Practitioner

## 2015-03-26 ENCOUNTER — Telehealth: Payer: Self-pay | Admitting: Nurse Practitioner

## 2015-04-08 NOTE — Telephone Encounter (Signed)
Pt is having breast pain on and off for the past couple of weeks, wants to see MMM. Pt given appt with MMM 6/1 at 12:30.

## 2015-04-16 ENCOUNTER — Ambulatory Visit: Payer: BC Managed Care – PPO | Admitting: Nurse Practitioner

## 2015-05-30 ENCOUNTER — Ambulatory Visit (INDEPENDENT_AMBULATORY_CARE_PROVIDER_SITE_OTHER): Payer: BC Managed Care – PPO | Admitting: Nurse Practitioner

## 2015-05-30 ENCOUNTER — Encounter: Payer: Self-pay | Admitting: Nurse Practitioner

## 2015-05-30 ENCOUNTER — Other Ambulatory Visit: Payer: Self-pay | Admitting: Nurse Practitioner

## 2015-05-30 VITALS — BP 114/72 | HR 77 | Temp 97.1°F | Ht 62.0 in | Wt 146.0 lb

## 2015-05-30 DIAGNOSIS — K253 Acute gastric ulcer without hemorrhage or perforation: Secondary | ICD-10-CM | POA: Diagnosis not present

## 2015-05-30 DIAGNOSIS — R079 Chest pain, unspecified: Secondary | ICD-10-CM

## 2015-05-30 DIAGNOSIS — Z1231 Encounter for screening mammogram for malignant neoplasm of breast: Secondary | ICD-10-CM

## 2015-05-30 MED ORDER — ESOMEPRAZOLE MAGNESIUM 40 MG PO CPDR: 40.0000 mg | DELAYED_RELEASE_CAPSULE | Freq: Every day | ORAL | Status: DC

## 2015-05-30 NOTE — Patient Instructions (Signed)
Food Choices for Peptic Ulcer Disease  When you have peptic ulcer disease, the foods you eat and your eating habits are very important. Choosing the right foods can help ease the discomfort of peptic ulcer disease.  WHAT GENERAL GUIDELINES DO I NEED TO FOLLOW?  · Choose fruits, vegetables, whole grains, and low-fat meat, fish, and poultry.    · Keep a food diary to identify foods that cause symptoms.  · Avoid foods that cause irritation or pain. These may be different for different people.  · Eat frequent small meals instead of three large meals each day. The pain may be worse when your stomach is empty.   · Avoid eating close to bedtime.  WHAT FOODS ARE NOT RECOMMENDED?  The following are some foods and drinks that may worsen your symptoms:  · Black, white, and red pepper.  · Hot sauce.  · Chili peppers.  · Chili powder.  · Chocolate and cocoa.     · Alcohol.  · Tea, coffee, and cola (regular and decaffeinated).  The items listed above may not be a complete list of foods and beverages to avoid. Contact your dietitian for more information.  Document Released: 01/24/2012 Document Revised: 11/06/2013 Document Reviewed: 09/05/2013  ExitCare® Patient Information ©2015 [employer]. This information is not intended to replace advice given to you by your health care provider. Make sure you discuss any questions you have with your health care provider.

## 2015-05-30 NOTE — Progress Notes (Signed)
   Subjective:    Patient ID: [name], female    DOB: 18-Jun-1961, 54 y.o.   MRN: [id number]  HPI Patient in today c/o chest pain that started a couple of weeks ago and has been intermittent. Pain started this morning and last about 5 hours- feels like a band around her lower chest. Denies any SOB, nausea or left arm pain. Rates pain 6/10- nothing makes it better ( tried ASA ). Nothing seems to set it off or make it worse.    Review of Systems  Constitutional: Negative for chills, appetite change and fatigue.  HENT: Negative.   Respiratory: Negative.   Cardiovascular: Negative.   Genitourinary: Negative.   Neurological: Negative.   Psychiatric/Behavioral: Negative.        Objective:   Physical Exam  Constitutional: She is oriented to person, place, and time. She appears well-developed and well-nourished.  Cardiovascular: Normal rate, regular rhythm and normal heart sounds.   Pulmonary/Chest: Effort normal and breath sounds normal.  Abdominal: Soft. Bowel sounds are normal. There is tenderness (epigastric).  Neurological: She is alert and oriented to person, place, and time.  Skin: Skin is warm.  Psychiatric: She has a normal mood and affect. Her behavior is normal. Judgment and thought content normal.   BP 114/72 mmHg  Pulse 77  Temp(Src) 97.1 F (36.2 C) (Oral)  Ht 5\' 2"  (1.575 m)  Wt 146 lb (66.225 kg)  BMI 26.70 kg/m2   EKG- Brendolyn PattyNSR-[name] Corrisa Gibby, FNP      Assessment & Plan:   1. Chest pain, unspecified chest pain type   2. Acute gastric ulcer    Meds ordered this encounter  Medications  . esomeprazole (NEXIUM) 40 MG capsule    Sig: Take 1 capsule (40 mg total) by mouth daily.    Dispense:  30 capsule    Refill:  3    Order Specific Question:  Supervising Provider    Answer:  Ernestina PennaMOORE, DONALD W [[id number]   Avoid spicy foods Do not eat 2 hours prior to bedtime    [name] Daphine DeutscherMartin, FNP

## 2015-06-25 ENCOUNTER — Ambulatory Visit (HOSPITAL_COMMUNITY): Payer: BC Managed Care – PPO

## 2015-09-19 ENCOUNTER — Other Ambulatory Visit: Payer: Self-pay | Admitting: Nurse Practitioner

## 2015-09-19 DIAGNOSIS — IMO0002 Reserved for concepts with insufficient information to code with codable children: Secondary | ICD-10-CM

## 2015-09-19 DIAGNOSIS — R229 Localized swelling, mass and lump, unspecified: Principal | ICD-10-CM

## 2015-10-14 ENCOUNTER — Encounter (HOSPITAL_COMMUNITY): Payer: BC Managed Care – PPO

## 2015-10-28 LAB — HM MAMMOGRAPHY

## 2015-11-28 ENCOUNTER — Encounter: Payer: Self-pay | Admitting: *Deleted

## 2015-12-02 ENCOUNTER — Telehealth: Payer: Self-pay | Admitting: Nurse Practitioner

## 2015-12-08 ENCOUNTER — Encounter: Payer: Self-pay | Admitting: Nurse Practitioner

## 2016-02-02 ENCOUNTER — Encounter: Payer: Self-pay | Admitting: Nurse Practitioner

## 2016-02-02 ENCOUNTER — Ambulatory Visit (INDEPENDENT_AMBULATORY_CARE_PROVIDER_SITE_OTHER): Payer: BC Managed Care – PPO | Admitting: Nurse Practitioner

## 2016-02-02 VITALS — BP 112/82 | HR 84 | Temp 97.9°F | Ht 62.0 in | Wt 155.0 lb

## 2016-02-02 DIAGNOSIS — Z01419 Encounter for gynecological examination (general) (routine) without abnormal findings: Secondary | ICD-10-CM | POA: Diagnosis not present

## 2016-02-02 DIAGNOSIS — Z0000 Encounter for general adult medical examination without abnormal findings: Secondary | ICD-10-CM | POA: Diagnosis not present

## 2016-02-02 DIAGNOSIS — F411 Generalized anxiety disorder: Secondary | ICD-10-CM

## 2016-02-02 DIAGNOSIS — Z1212 Encounter for screening for malignant neoplasm of rectum: Secondary | ICD-10-CM

## 2016-02-02 DIAGNOSIS — Z1159 Encounter for screening for other viral diseases: Secondary | ICD-10-CM

## 2016-02-02 LAB — URINALYSIS, COMPLETE
Bilirubin, UA: NEGATIVE
GLUCOSE, UA: NEGATIVE
Ketones, UA: NEGATIVE
LEUKOCYTES UA: NEGATIVE
Nitrite, UA: NEGATIVE
PROTEIN UA: NEGATIVE
RBC, UA: NEGATIVE
Specific Gravity, UA: >1.03 — ABNORMAL HIGH (ref 1.005–1.030)
Urobilinogen, Ur: 1 mg/dL (ref 0.2–1.0)
pH, UA: 5.5 (ref 5.0–7.5)

## 2016-02-02 LAB — MICROSCOPIC EXAMINATION
BACTERIA UA: NONE SEEN
RENAL EPITHEL UA: NONE SEEN /HPF

## 2016-02-02 MED ORDER — FLUOXETINE HCL 20 MG PO TABS: 20.0000 mg | ORAL_TABLET | Freq: Every day | ORAL | Status: DC

## 2016-02-02 MED ORDER — EPINEPHRINE 0.15 MG/0.15ML IJ SOAJ: 0.1500 mg | INTRAMUSCULAR | Status: DC | PRN

## 2016-02-02 NOTE — Progress Notes (Signed)
   Subjective:    Patient ID: [name], female    DOB: 07-12-1961, 55 y.o.   MRN: [id number]    No current outpatient prescriptions on file prior to visit.   No current facility-administered medications on file prior to visit.     HPI  Wants to get back on Prozac because of feeling shaky. Having headaches several times a week. Wants to have HPV testing with her PAP. Wants HCV test. Needs renewal on Epi pen.   Review of Systems  Constitutional: Positive for appetite change.       Eating more than usual  HENT:       Lens implants and cataract removal about a year ago.  Eyes: Negative.   Respiratory: Negative.   Cardiovascular: Negative.   Gastrointestinal: Negative.   Endocrine: Negative.   Genitourinary: Negative.        Previous hysterectomy  Musculoskeletal: Negative.   Skin: Negative.   Allergic/Immunologic: Negative.   Neurological: Positive for headaches.  Psychiatric/Behavioral: Positive for sleep disturbance. The patient is nervous/anxious.        Feeling fatigued, wanting to sleep alot         Objective:   Physical Exam  Constitutional: She is oriented to person, place, and time. She appears well-developed and well-nourished.  HENT:  Right Ear: External ear normal.  Left Ear: External ear normal.  Nose: Nose normal.  Eyes: Conjunctivae are normal. Pupils are equal, round, and reactive to light.  Neck: Normal range of motion.  Cardiovascular: Normal rate, regular rhythm, normal heart sounds and intact distal pulses.   Pulmonary/Chest: Effort normal and breath sounds normal.  Abdominal: Soft. Bowel sounds are normal.  Musculoskeletal: Normal range of motion.  Neurological: She is alert and oriented to person, place, and time.  Skin: Skin is warm and dry.  Psychiatric: She has a normal mood and affect. Her behavior is normal. Judgment and thought content normal.    BP 112/82 mmHg  Pulse 84  Temp(Src) 97.9 F (36.6 C) (Oral)  Ht _0  (1.575 m)   Wt 155 lb (70.308 kg)  BMI 28.34 kg/m2      Assessment & Plan:  1. Annual physical exam - Urinalysis, Complete - CMP14+EGFR - CBC with Differential/Platelet - Lipid panel - Thyroid Panel With TSH  2. Anxiety state Stress management  3. Need for hepatitis C screening test - Hepatitis C antibody  4. Encounter for routine gynecological examination - Pap IG w/ reflex to HPV when ASC-U  hemoccult cards given to patient with directions Continue all meds Labs pending Health Maintenance reviewed Diet and exercise encouraged RTO 1 year  [name], FNP

## 2016-02-02 NOTE — Addendum Note (Signed)
Addended by: Bennie PieriniMARTIN, [name] on: 02/02/2016 11:53 AM   Modules accepted: Orders, SmartSet

## 2016-02-02 NOTE — Addendum Note (Signed)
Addended by: Bennie PieriniMARTIN, [name] on: 02/02/2016 11:54 AM   Modules accepted: Orders, SmartSet

## 2016-02-02 NOTE — Patient Instructions (Signed)
Health Maintenance, Female Adopting a healthy lifestyle and getting preventive care can go a long way to promote health and wellness. Talk with your health care provider about what schedule of regular examinations is right for you. This is a good chance for you to check in with your provider about disease prevention and staying healthy. In between checkups, there are plenty of things you can do on your own. Experts have done a lot of research about which lifestyle changes and preventive measures are most likely to keep you healthy. Ask your health care provider for more information. WEIGHT AND DIET  Eat a healthy diet  Be sure to include plenty of vegetables, fruits, low-fat dairy products, and lean protein.  Do not eat a lot of foods high in solid fats, added sugars, or salt.  Get regular exercise. This is one of the most important things you can do for your health.  Most adults should exercise for at least 150 minutes each week. The exercise should increase your heart rate and make you sweat (moderate-intensity exercise).  Most adults should also do strengthening exercises at least twice a week. This is in addition to the moderate-intensity exercise.  Maintain a healthy weight  Body mass index (BMI) is a measurement that can be used to identify possible weight problems. It estimates body fat based on height and weight. Your health care provider can help determine your BMI and help you achieve or maintain a healthy weight.  For females 20 years of age and older:   A BMI below 18.5 is considered underweight.  A BMI of 18.5 to 24.9 is normal.  A BMI of 25 to 29.9 is considered overweight.  A BMI of 30 and above is considered obese.  Watch levels of cholesterol and blood lipids  You should start having your blood tested for lipids and cholesterol at 56 years of age, then have this test every 5 years.  You may need to have your cholesterol levels checked more often if:  Your lipid  or cholesterol levels are high.  You are older than 56 years of age.  You are at high risk for heart disease.  CANCER SCREENING   Lung Cancer  Lung cancer screening is recommended for adults 55-80 years old who are at high risk for lung cancer because of a history of smoking.  A yearly low-dose CT scan of the lungs is recommended for people who:  Currently smoke.  Have quit within the past 15 years.  Have at least a 30-pack-year history of smoking. A pack year is smoking an average of one pack of cigarettes a day for 1 year.  Yearly screening should continue until it has been 15 years since you quit.  Yearly screening should stop if you develop a health problem that would prevent you from having lung cancer treatment.  Breast Cancer  Practice breast self-awareness. This means understanding how your breasts normally appear and feel.  It also means doing regular breast self-exams. Let your health care provider know about any changes, no matter how small.  If you are in your 20s or 30s, you should have a clinical breast exam (CBE) by a health care provider every 1-3 years as part of a regular health exam.  If you are 40 or older, have a CBE every year. Also consider having a breast X-ray (mammogram) every year.  If you have a family history of breast cancer, talk to your health care provider about genetic screening.  If you   are at high risk for breast cancer, talk to your health care provider about having an MRI and a mammogram every year.  Breast cancer gene (BRCA) assessment is recommended for women who have family members with BRCA-related cancers. BRCA-related cancers include:  Breast.  Ovarian.  Tubal.  Peritoneal cancers.  Results of the assessment will determine the need for genetic counseling and BRCA1 and BRCA2 testing. Cervical Cancer Your health care provider may recommend that you be screened regularly for cancer of the pelvic organs (ovaries, uterus, and  vagina). This screening involves a pelvic examination, including checking for microscopic changes to the surface of your cervix (Pap test). You may be encouraged to have this screening done every 3 years, beginning at age 21.  For women ages 30-65, health care providers may recommend pelvic exams and Pap testing every 3 years, or they may recommend the Pap and pelvic exam, combined with testing for human papilloma virus (HPV), every 5 years. Some types of HPV increase your risk of cervical cancer. Testing for HPV may also be done on women of any age with unclear Pap test results.  Other health care providers may not recommend any screening for nonpregnant women who are considered low risk for pelvic cancer and who do not have symptoms. Ask your health care provider if a screening pelvic exam is right for you.  If you have had past treatment for cervical cancer or a condition that could lead to cancer, you need Pap tests and screening for cancer for at least 20 years after your treatment. If Pap tests have been discontinued, your risk factors (such as having a new sexual partner) need to be reassessed to determine if screening should resume. Some women have medical problems that increase the chance of getting cervical cancer. In these cases, your health care provider may recommend more frequent screening and Pap tests. Colorectal Cancer  This type of cancer can be detected and often prevented.  Routine colorectal cancer screening usually begins at 56 years of age and continues through 56 years of age.  Your health care provider may recommend screening at an earlier age if you have risk factors for colon cancer.  Your health care provider may also recommend using home test kits to check for hidden blood in the stool.  A small camera at the end of a tube can be used to examine your colon directly (sigmoidoscopy or colonoscopy). This is done to check for the earliest forms of colorectal  cancer.  Routine screening usually begins at age 50.  Direct examination of the colon should be repeated every 5-10 years through 56 years of age. However, you may need to be screened more often if early forms of precancerous polyps or small growths are found. Skin Cancer  Check your skin from head to toe regularly.  Tell your health care provider about any new moles or changes in moles, especially if there is a change in a mole's shape or color.  Also tell your health care provider if you have a mole that is larger than the size of a pencil eraser.  Always use sunscreen. Apply sunscreen liberally and repeatedly throughout the day.  Protect yourself by wearing long sleeves, pants, a wide-brimmed hat, and sunglasses whenever you are outside. HEART DISEASE, DIABETES, AND HIGH BLOOD PRESSURE   High blood pressure causes heart disease and increases the risk of stroke. High blood pressure is more likely to develop in:  People who have blood pressure in the high end   of the normal range (130-139/85-89 mm Hg).  People who are overweight or obese.  People who are African American.  If you are 38-23 years of age, have your blood pressure checked every 3-5 years. If you are 61 years of age or older, have your blood pressure checked every year. You should have your blood pressure measured twice--once when you are at a hospital or clinic, and once when you are not at a hospital or clinic. Record the average of the two measurements. To check your blood pressure when you are not at a hospital or clinic, you can use:  An automated blood pressure machine at a pharmacy.  A home blood pressure monitor.  If you are between 45 years and 39 years old, ask your health care provider if you should take aspirin to prevent strokes.  Have regular diabetes screenings. This involves taking a blood sample to check your fasting blood sugar level.  If you are at a normal weight and have a low risk for diabetes,  have this test once every three years after 56 years of age.  If you are overweight and have a high risk for diabetes, consider being tested at a younger age or more often. PREVENTING INFECTION  Hepatitis B  If you have a higher risk for hepatitis B, you should be screened for this virus. You are considered at high risk for hepatitis B if:  You were born in a country where hepatitis B is common. Ask your health care provider which countries are considered high risk.  Your parents were born in a high-risk country, and you have not been immunized against hepatitis B (hepatitis B vaccine).  You have HIV or AIDS.  You use needles to inject street drugs.  You live with someone who has hepatitis B.  You have had sex with someone who has hepatitis B.  You get hemodialysis treatment.  You take certain medicines for conditions, including cancer, organ transplantation, and autoimmune conditions. Hepatitis C  Blood testing is recommended for:  Everyone born from [birth year] through 1966.  Anyone with known risk factors for hepatitis C. Sexually transmitted infections (STIs)  You should be screened for sexually transmitted infections (STIs) including gonorrhea and chlamydia if:  You are sexually active and are younger than 56 years of age.  You are older than 56 years of age and your health care provider tells you that you are at risk for this type of infection.  Your sexual activity has changed since you were last screened and you are at an increased risk for chlamydia or gonorrhea. Ask your health care provider if you are at risk.  If you do not have HIV, but are at risk, it may be recommended that you take a prescription medicine daily to prevent HIV infection. This is called pre-exposure prophylaxis (PrEP). You are considered at risk if:  You are sexually active and do not regularly use condoms or know the HIV status of your partner(s).  You take drugs by injection.  You are sexually  active with a partner who has HIV. Talk with your health care provider about whether you are at high risk of being infected with HIV. If you choose to begin PrEP, you should first be tested for HIV. You should then be tested every 3 months for as long as you are taking PrEP.  PREGNANCY   If you are premenopausal and you may become pregnant, ask your health care provider about preconception counseling.  If you may  become pregnant, take 400 to 800 micrograms (mcg) of folic acid every day.  If you want to prevent pregnancy, talk to your health care provider about birth control (contraception). OSTEOPOROSIS AND MENOPAUSE   Osteoporosis is a disease in which the bones lose minerals and strength with aging. This can result in serious bone fractures. Your risk for osteoporosis can be identified using a bone density scan.  If you are 61 years of age or older, or if you are at risk for osteoporosis and fractures, ask your health care provider if you should be screened.  Ask your health care provider whether you should take a calcium or vitamin D supplement to lower your risk for osteoporosis.  Menopause may have certain physical symptoms and risks.  Hormone replacement therapy may reduce some of these symptoms and risks. Talk to your health care provider about whether hormone replacement therapy is right for you.  HOME CARE INSTRUCTIONS   Schedule regular health, dental, and eye exams.  Stay current with your immunizations.   Do not use any tobacco products including cigarettes, chewing tobacco, or electronic cigarettes.  If you are pregnant, do not drink alcohol.  If you are breastfeeding, limit how much and how often you drink alcohol.  Limit alcohol intake to no more than 1 drink per day for nonpregnant women. One drink equals 12 ounces of beer, 5 ounces of wine, or 1 ounces of hard liquor.  Do not use street drugs.  Do not share needles.  Ask your health care provider for help if  you need support or information about quitting drugs.  Tell your health care provider if you often feel depressed.  Tell your health care provider if you have ever been abused or do not feel safe at home.   This information is not intended to replace advice given to you by your health care provider. Make sure you discuss any questions you have with your health care provider.   Document Released: 05/17/2011 Document Revised: 11/22/2014 Document Reviewed: 10/03/2013 Elsevier Interactive Patient Education [employer].

## 2016-02-03 LAB — CBC WITH DIFFERENTIAL/PLATELET
BASOS ABS: 0 10*3/uL (ref 0.0–0.2)
BASOS: 0 %
EOS (ABSOLUTE): 0.1 10*3/uL (ref 0.0–0.4)
Eos: 2 %
HEMATOCRIT: 41.1 % (ref 34.0–46.6)
HEMOGLOBIN: 14.1 g/dL (ref 11.1–15.9)
Immature Grans (Abs): 0 10*3/uL (ref 0.0–0.1)
Immature Granulocytes: 0 %
Lymphocytes Absolute: 2.4 10*3/uL (ref 0.7–3.1)
Lymphs: 34 %
MCH: 30.4 pg (ref 26.6–33.0)
MCHC: 34.3 g/dL (ref 31.5–35.7)
MCV: 89 fL (ref 79–97)
MONOS ABS: 0.4 10*3/uL (ref 0.1–0.9)
Monocytes: 6 %
NEUTROS ABS: 4 10*3/uL (ref 1.4–7.0)
NEUTROS PCT: 58 %
Platelets: 308 10*3/uL (ref 150–379)
RBC: 4.64 x10E6/uL (ref 3.77–5.28)
RDW: 13.4 % (ref 12.3–15.4)
WBC: 7 10*3/uL (ref 3.4–10.8)

## 2016-02-03 LAB — CMP14+EGFR
A/G RATIO: 1.8 (ref 1.2–2.2)
ALT: 31 IU/L (ref 0–32)
AST: 29 IU/L (ref 0–40)
Albumin: 4.7 g/dL (ref 3.5–5.5)
Alkaline Phosphatase: 81 IU/L (ref 39–117)
BUN/Creatinine Ratio: 26 — ABNORMAL HIGH (ref 9–23)
BUN: 18 mg/dL (ref 6–24)
Bilirubin Total: 0.5 mg/dL (ref 0.0–1.2)
CALCIUM: 9.6 mg/dL (ref 8.7–10.2)
CO2: 24 mmol/L (ref 18–29)
Chloride: 100 mmol/L (ref 96–106)
Creatinine, Ser: 0.68 mg/dL (ref 0.57–1.00)
GFR, EST AFRICAN AMERICAN: 114 mL/min/{1.73_m2} (ref >59)
GFR, EST NON AFRICAN AMERICAN: 99 mL/min/{1.73_m2} (ref >59)
Globulin, Total: 2.6 g/dL (ref 1.5–4.5)
Glucose: 67 mg/dL (ref 65–99)
POTASSIUM: 4.5 mmol/L (ref 3.5–5.2)
SODIUM: 141 mmol/L (ref 134–144)
Total Protein: 7.3 g/dL (ref 6.0–8.5)

## 2016-02-03 LAB — LIPID PANEL
CHOL/HDL RATIO: 3.1 ratio (ref 0.0–4.4)
CHOLESTEROL TOTAL: 251 mg/dL — AB (ref 100–199)
HDL: 82 mg/dL (ref >39)
LDL Calculated: 152 mg/dL — ABNORMAL HIGH (ref 0–99)
TRIGLYCERIDES: 86 mg/dL (ref 0–149)
VLDL Cholesterol Cal: 17 mg/dL (ref 5–40)

## 2016-02-03 LAB — HCV ANTIBODY: Hep C Virus Ab: <0.1 s/co ratio (ref 0.0–0.9)

## 2016-02-03 LAB — THYROID PANEL WITH TSH
FREE THYROXINE INDEX: 1.9 (ref 1.2–4.9)
T3 UPTAKE RATIO: 26 % (ref 24–39)
T4 TOTAL: 7.3 ug/dL (ref 4.5–12.0)
TSH: 1.9 u[IU]/mL (ref 0.450–4.500)

## 2016-02-04 LAB — PAP IG W/ RFLX HPV ASCU: PAP Smear Comment: 0

## 2016-07-29 ENCOUNTER — Observation Stay (HOSPITAL_BASED_OUTPATIENT_CLINIC_OR_DEPARTMENT_OTHER): Payer: BC Managed Care – PPO

## 2016-07-29 ENCOUNTER — Emergency Department (HOSPITAL_COMMUNITY): Payer: BC Managed Care – PPO

## 2016-07-29 ENCOUNTER — Encounter (HOSPITAL_COMMUNITY): Payer: Self-pay

## 2016-07-29 ENCOUNTER — Observation Stay (HOSPITAL_COMMUNITY)
Admission: EM | Admit: 2016-07-29 | Discharge: 2016-07-29 | Disposition: A | Discharge status: Home / Self Care | Payer: BC Managed Care – PPO | Attending: Internal Medicine | Admitting: Internal Medicine

## 2016-07-29 DIAGNOSIS — Z79899 Other long term (current) drug therapy: Secondary | ICD-10-CM | POA: Diagnosis not present

## 2016-07-29 DIAGNOSIS — E785 Hyperlipidemia, unspecified: Secondary | ICD-10-CM | POA: Diagnosis present

## 2016-07-29 DIAGNOSIS — I200 Unstable angina: Secondary | ICD-10-CM | POA: Insufficient documentation

## 2016-07-29 DIAGNOSIS — R0789 Other chest pain: Secondary | ICD-10-CM | POA: Diagnosis present

## 2016-07-29 DIAGNOSIS — K2960 Other gastritis without bleeding: Secondary | ICD-10-CM

## 2016-07-29 DIAGNOSIS — R05 Cough: Secondary | ICD-10-CM | POA: Insufficient documentation

## 2016-07-29 DIAGNOSIS — R079 Chest pain, unspecified: Secondary | ICD-10-CM | POA: Diagnosis not present

## 2016-07-29 DIAGNOSIS — T50905A Adverse effect of unspecified drugs, medicaments and biological substances, initial encounter: Secondary | ICD-10-CM | POA: Diagnosis present

## 2016-07-29 DIAGNOSIS — Z792 Long term (current) use of antibiotics: Secondary | ICD-10-CM | POA: Diagnosis not present

## 2016-07-29 DIAGNOSIS — R110 Nausea: Secondary | ICD-10-CM | POA: Insufficient documentation

## 2016-07-29 LAB — LIPID PANEL
CHOL/HDL RATIO: 3.5 ratio
CHOLESTEROL: 244 mg/dL — AB (ref 0–200)
HDL: 69 mg/dL (ref >40)
LDL CALC: 152 mg/dL — AB (ref 0–99)
TRIGLYCERIDES: 115 mg/dL (ref <150)
VLDL: 23 mg/dL (ref 0–40)

## 2016-07-29 LAB — CBC WITH DIFFERENTIAL/PLATELET
BASOS PCT: 1 %
Basophils Absolute: 0 10*3/uL (ref 0.0–0.1)
Eosinophils Absolute: 0.3 10*3/uL (ref 0.0–0.7)
Eosinophils Relative: 4 %
HEMATOCRIT: 39.9 % (ref 36.0–46.0)
HEMOGLOBIN: 13.4 g/dL (ref 12.0–15.0)
LYMPHS PCT: 45 %
Lymphs Abs: 3 10*3/uL (ref 0.7–4.0)
MCH: 30.7 pg (ref 26.0–34.0)
MCHC: 33.6 g/dL (ref 30.0–36.0)
MCV: 91.3 fL (ref 78.0–100.0)
MONOS PCT: 9 %
Monocytes Absolute: 0.6 10*3/uL (ref 0.1–1.0)
NEUTROS ABS: 2.7 10*3/uL (ref 1.7–7.7)
NEUTROS PCT: 41 %
Platelets: 311 10*3/uL (ref 150–400)
RBC: 4.37 MIL/uL (ref 3.87–5.11)
RDW: 12.9 % (ref 11.5–15.5)
WBC: 6.6 10*3/uL (ref 4.0–10.5)

## 2016-07-29 LAB — ECHOCARDIOGRAM COMPLETE
E decel time: 208 msec
EERAT: 8.62
FS: 51 % — AB (ref 28–44)
Height: 62 in
IV/PV OW: 0.67
LA diam index: 1.89 cm/m2
LA vol A4C: 28.8 ml
LASIZE: 32 mm
LDCA: 3.14 cm2
LEFT ATRIUM END SYS DIAM: 32 mm
LV E/e' medial: 8.62
LV E/e'average: 8.62
LV e' LATERAL: 10.7 cm/s
LVOTD: 20 mm
MV Dec: 208
MVPG: 3 mmHg
MVPKAVEL: 52.4 m/s
MVPKEVEL: 92.2 m/s
PW: 15.3 mm — AB (ref 0.6–1.1)
RV TAPSE: 21.1 mm
TDI e' lateral: 10.7
TDI e' medial: 10.2
Weight: 2401.6 oz

## 2016-07-29 LAB — BASIC METABOLIC PANEL
Anion gap: 9 (ref 5–15)
BUN: 18 mg/dL (ref 6–20)
CHLORIDE: 102 mmol/L (ref 101–111)
CO2: 28 mmol/L (ref 22–32)
Calcium: 9.5 mg/dL (ref 8.9–10.3)
Creatinine, Ser: 0.86 mg/dL (ref 0.44–1.00)
GFR calc non Af Amer: >60 mL/min (ref >60)
Glucose, Bld: 105 mg/dL — ABNORMAL HIGH (ref 65–99)
Potassium: 3.9 mmol/L (ref 3.5–5.1)
Sodium: 139 mmol/L (ref 135–145)

## 2016-07-29 LAB — TSH: TSH: 1.86 u[IU]/mL (ref 0.350–4.500)

## 2016-07-29 LAB — TROPONIN I
Troponin I: <0.03 ng/mL (ref <0.03)
Troponin I: <0.03 ng/mL (ref <0.03)
Troponin I: <0.03 ng/mL (ref <0.03)

## 2016-07-29 MED ORDER — PANTOPRAZOLE SODIUM 40 MG PO TBEC
40.0000 mg | DELAYED_RELEASE_TABLET | Freq: Two times a day (BID) | ORAL | Status: DC
  Administered 2016-07-29: 40 mg via ORAL
  Filled 2016-07-29: qty 1

## 2016-07-29 MED ORDER — ASPIRIN 81 MG PO CHEW
81.0000 mg | CHEWABLE_TABLET | Freq: Once | ORAL | Status: AC
  Administered 2016-07-29: 81 mg via ORAL
  Filled 2016-07-29: qty 1

## 2016-07-29 MED ORDER — PANTOPRAZOLE SODIUM 40 MG PO TBEC: 40.0000 mg | DELAYED_RELEASE_TABLET | Freq: Two times a day (BID) | ORAL | 0 refills | Status: DC

## 2016-07-29 MED ORDER — SUCRALFATE 1 G PO TABS
1.0000 g | ORAL_TABLET | Freq: Three times a day (TID) | ORAL | Status: DC
  Administered 2016-07-29: 1 g via ORAL
  Filled 2016-07-29: qty 1

## 2016-07-29 MED ORDER — NITROGLYCERIN 0.4 MG SL SUBL: 0.4000 mg | SUBLINGUAL_TABLET | SUBLINGUAL | 0 refills | Status: AC | PRN

## 2016-07-29 MED ORDER — ONDANSETRON HCL 4 MG/2ML IJ SOLN
4.0000 mg | Freq: Once | INTRAMUSCULAR | Status: AC
  Administered 2016-07-29: 4 mg via INTRAVENOUS

## 2016-07-29 MED ORDER — ACETAMINOPHEN 325 MG PO TABS
650.0000 mg | ORAL_TABLET | Freq: Four times a day (QID) | ORAL | Status: DC | PRN
  Administered 2016-07-29: 650 mg via ORAL
  Filled 2016-07-29: qty 2

## 2016-07-29 MED ORDER — NITROGLYCERIN 2 % TD OINT
1.0000 [in_us] | TOPICAL_OINTMENT | Freq: Once | TRANSDERMAL | Status: AC
  Administered 2016-07-29: 1 [in_us] via TOPICAL
  Filled 2016-07-29: qty 1

## 2016-07-29 MED ORDER — ENOXAPARIN SODIUM 80 MG/0.8ML ~~LOC~~ SOLN
1.0000 mg/kg | Freq: Once | SUBCUTANEOUS | Status: AC
Start: 2016-07-29 — End: 2016-07-29
  Administered 2016-07-29: 70 mg via SUBCUTANEOUS
  Filled 2016-07-29: qty 0.8

## 2016-07-29 MED ORDER — ONDANSETRON HCL 4 MG/2ML IJ SOLN
INTRAMUSCULAR | Status: AC
  Administered 2016-07-29: 4 mg via INTRAVENOUS
  Filled 2016-07-29: qty 2

## 2016-07-29 MED ORDER — ALUM & MAG HYDROXIDE-SIMETH 200-200-20 MG/5ML PO SUSP: 15.0000 mL | ORAL | Status: DC | PRN

## 2016-07-29 MED ORDER — FLUOXETINE HCL 20 MG PO CAPS
20.0000 mg | ORAL_CAPSULE | Freq: Every day | ORAL | Status: DC
  Administered 2016-07-29: 20 mg via ORAL
  Filled 2016-07-29: qty 1

## 2016-07-29 MED ORDER — ASPIRIN 325 MG PO TABS
325.0000 mg | ORAL_TABLET | Freq: Every day | ORAL | Status: DC
  Administered 2016-07-29: 325 mg via ORAL
  Filled 2016-07-29: qty 1

## 2016-07-29 MED ORDER — MORPHINE SULFATE (PF) 4 MG/ML IV SOLN
4.0000 mg | Freq: Once | INTRAVENOUS | Status: AC
  Administered 2016-07-29: 4 mg via INTRAVENOUS
  Filled 2016-07-29: qty 1

## 2016-07-29 MED ORDER — PANTOPRAZOLE SODIUM 40 MG PO TBEC
40.0000 mg | DELAYED_RELEASE_TABLET | Freq: Every day | ORAL | Status: DC
  Administered 2016-07-29: 40 mg via ORAL
  Filled 2016-07-29: qty 1

## 2016-07-29 MED ORDER — SUCRALFATE 1 G PO TABS: 1.0000 g | ORAL_TABLET | Freq: Three times a day (TID) | ORAL | 0 refills | Status: DC

## 2016-07-29 MED ORDER — ALUM & MAG HYDROXIDE-SIMETH 200-200-20 MG/5ML PO SUSP: 15.0000 mL | ORAL | 0 refills | Status: DC | PRN

## 2016-07-29 MED ORDER — DEXTROSE-NACL 5-0.9 % IV SOLN
INTRAVENOUS | Status: DC
  Administered 2016-07-29: 09:00:00 via INTRAVENOUS

## 2016-07-29 NOTE — Progress Notes (Signed)
F/u troponin, EKG, and echo overall unremarkable. Ok for discharge, we will arrange outpatient GXT and cardiology follow up.   Dominga FerryJ Venesha Petraitis MD

## 2016-07-29 NOTE — ED Notes (Signed)
Patient awaiting bed placement at this time.

## 2016-07-29 NOTE — H&P (Signed)
History and Physical    [name]:[id number] DOB: [birth year] DOA: 07/29/2016  PCP: Bennie Pierini, FNP  Patient coming from: Home   Chief Complaint: Chest pain   HPI: [name] is a 55 y.o. female with a past medical history significant for diabetes and HDL  presented with chest pain that began one hour ago. She states this chest pain woke her up. She states this chest pain felt tight and radiates to her jaw. She has associated diaphoresis and coughing. She denies nausea, vomiting, diarrhea, melena, and abdominal pain. While in the ED, she was given  ASA, NTG ointment, and morphine which improved her chest pain. She was admitted for further observation of chest pain.  She had no exertional CP, and five years ago, had stress test, details unclear.   ED Course: CXR negative. EKG unremarkable.   Review of Systems: As per HPI otherwise 10 point review of systems negative.    Past Medical History:  Diagnosis Date  . Cataract   . Heart attack (HCC)   . Idiopathic anaphylactic reaction     Past Surgical History:  Procedure Laterality Date  . ABDOMINAL HYSTERECTOMY    . CATARACT EXTRACTION W/PHACO Left 12/24/2013   Procedure: CATARACT EXTRACTION PHACO AND INTRAOCULAR LENS PLACEMENT (IOC);  Surgeon: Gemma Payor, MD;  Location: [facility];  Service: Ophthalmology;  Laterality: Left;  CDE:  10.84  . CATARACT EXTRACTION W/PHACO Right 01/07/2014   Procedure: CATARACT EXTRACTION PHACO AND INTRAOCULAR LENS PLACEMENT (IOC);  Surgeon: Gemma Payor, MD;  Location: [facility];  Service: Ophthalmology;  Laterality: Right;  CDE:7.76  . TUBAL LIGATION       reports that she has never smoked. She has never used smokeless tobacco. She reports that she drinks alcohol. She reports that she does not use drugs.  Allergies  Allergen Reactions  . Mango Flavor Anaphylaxis  . Meperidine Hcl Other (See Comments)    SYNCOPE    No family history on file.  Prior to Admission medications     Medication Sig Start Date End Date Taking? Authorizing Provider  amoxicillin (AMOXIL) 500 MG capsule Take 500 mg by mouth 3 (three) times daily.   Yes Historical Provider, MD  FLUoxetine (PROZAC) 20 MG tablet Take 1 tablet (20 mg total) by mouth daily. 02/02/16  Yes [name] Daphine Deutscher, FNP  EPINEPHrine 0.15 MG/0.15ML IJ injection Inject 0.15 mLs (0.15 mg total) into the muscle as needed for anaphylaxis. 02/02/16   [name] Daphine Deutscher, FNP    Physical Exam: Vitals:   07/29/16 0108 07/29/16 0111  BP:  (!) 146/112  Pulse:  65  Resp:  20  Temp:  97.9 F (36.6 C)  TempSrc:  Oral  SpO2:  100%  Weight: 68 kg (150 lb)   Height: 5\' 2"  (1.575 m)       Constitutional: NAD, calm, comfortable Vitals:   07/29/16 0108 07/29/16 0111  BP:  (!) 146/112  Pulse:  65  Resp:  20  Temp:  97.9 F (36.6 C)  TempSrc:  Oral  SpO2:  100%  Weight: 68 kg (150 lb)   Height: 5\' 2"  (1.575 m)    Eyes: PERRL, lids and conjunctivae normal ENMT: Mucous membranes are moist. Posterior pharynx clear of any exudate or lesions.Normal dentition.  Neck: normal, supple, no masses, no thyromegaly Respiratory: clear to auscultation bilaterally, no wheezing, no crackles. Normal respiratory effort. No accessory muscle use.  Cardiovascular: Regular rate and rhythm, no murmurs / rubs / gallops. No extremity edema. 2+ pedal pulses.  No carotid bruits.  Abdomen: no tenderness, no masses palpated. No hepatosplenomegaly. Bowel sounds positive.  Musculoskeletal: no clubbing / cyanosis. No joint deformity upper and lower extremities. Good ROM, no contractures. Normal muscle tone.  Skin: no rashes, lesions, ulcers. No induration Neurologic: CN 2-12 grossly intact. Sensation intact, DTR normal. Strength 5/5 in all 4.  Psychiatric: Normal judgment and insight. Alert and oriented x 3. Normal mood.     Labs on Admission: I have personally reviewed following labs and imaging studies  CBC:  Recent Labs Lab 07/29/16 0121   WBC 6.6  NEUTROABS 2.7  HGB 13.4  HCT 39.9  MCV 91.3  PLT 311   Basic Metabolic Panel:  Recent Labs Lab 07/29/16 0121  NA 139  K 3.9  CL 102  CO2 28  GLUCOSE 105*  BUN 18  CREATININE 0.86  CALCIUM 9.5   Cardiac Enzymes:  Recent Labs Lab 07/29/16 0121  TROPONINI <0.03   Urine analysis:    Component Value Date/Time   COLORURINE YELLOW 01/04/2011 1720   APPEARANCEUR Clear 02/02/2016 1114   LABSPEC 1.023 01/04/2011 1720   PHURINE 6.5 01/04/2011 1720   GLUCOSEU Negative 02/02/2016 1114   HGBUR NEGATIVE 01/04/2011 1720   BILIRUBINUR Negative 02/02/2016 1114   KETONESUR NEGATIVE 01/04/2011 1720   PROTEINUR Negative 02/02/2016 1114   PROTEINUR NEGATIVE 01/04/2011 1720   UROBILINOGEN 1.0 01/04/2011 1720   NITRITE Negative 02/02/2016 1114   NITRITE NEGATIVE 01/04/2011 1720   LEUKOCYTESUR Negative 02/02/2016 1114     Radiological Exams on Admission: Dg Chest 2 View  Result Date: 07/29/2016 CLINICAL DATA:  Midsternal chest pain for 1 hour.  Nausea. EXAM: CHEST  2 VIEW COMPARISON:  01/04/2011 FINDINGS: The cardiomediastinal contours are normal. The lungs are clear. Pulmonary vasculature is normal. No consolidation, pleural effusion, or pneumothorax. No acute osseous abnormalities are seen. IMPRESSION: No active cardiopulmonary disease. Electronically Signed   By: Rubye OaksMelanie  Ehinger M.D.   On: 07/29/2016 01:45    EKG: Independently reviewed. EKG unremarkable   Assessment/Plan  1. Chest pain. Unclear etiology, though history is concerning for unstable angina. Will rule out MI.  Initial troponin is negative. EKG unremarkable. CXR negative. Will consult cardiology for further recommendation and consider nuclear stress test once r/out.  Will give one dose of full Lovenox until all troponins were negative.  2. DM. Controlled. Continue SSI.  3. HLD.  She had myalgia with statin previously, even taken with CoQ10.  Will check lipid profile.   DVT prophylaxis: Lovenox  Code  Status: Full  Family Communication: Husband bedside  Disposition Plan: Discharge home once improved.  Consults called: Cardiology  Admission status: Inpatient    Houston SirenPeter Natelie Ostrosky, MD FACP [facility] If 7PM-7AM, please contact night-coverage [email or web address] Password [email or web address]  07/29/2016, 2:35 AM   By signing my name below, I, Cynda AcresHailei Fulton, attest that this documentation has been prepared under the direction and in the presence of Houston SirenPeter Casandra Dallaire, MD. Electronically signed: Cynda AcresHailei Fulton, Scribe. 07/29/16 2:36 PM

## 2016-07-29 NOTE — ED Triage Notes (Signed)
Pt reports mid sternal chest pain that started approx 1 hour ago with nausea. Pt states she took 2 baby aspirin at home in the past hour

## 2016-07-29 NOTE — ED Provider Notes (Signed)
[facility] Provider Note   CSN: [id number] Arrival date & time: 07/29/16  0059     History   Chief Complaint Chief Complaint  Patient presents with  . Chest Pain    HPI [name] is a 55 y.o. female.  HPI  This is a 55 year old female with a history of "borderline diabetes and hyperlipidemia" who presents with chest pain. Patient reports that one hour prior to arrival she woke up with pressure-like chest pain. It radiates into her jaw. She reports nausea without emesis. No shortness of breath. She does report cough. No fever. She reports a prior "cardiac event" but reports negative stress testing and no cardiac cath. Currently patient rates her pain at 6 out of 10. She took 2 aspirin prior to arrival.   Past Medical History:  Diagnosis Date  . Cataract   . Heart attack (HCC)   . Idiopathic anaphylactic reaction     Patient Active Problem List   Diagnosis Date Noted  . Annual physical exam 02/02/2016  . Anxiety state 01/15/2011    Past Surgical History:  Procedure Laterality Date  . ABDOMINAL HYSTERECTOMY    . CATARACT EXTRACTION W/PHACO Left 12/24/2013   Procedure: CATARACT EXTRACTION PHACO AND INTRAOCULAR LENS PLACEMENT (IOC);  Surgeon: Gemma PayorKerry Hunt, MD;  Location: [facility];  Service: Ophthalmology;  Laterality: Left;  CDE:  10.84  . CATARACT EXTRACTION W/PHACO Right 01/07/2014   Procedure: CATARACT EXTRACTION PHACO AND INTRAOCULAR LENS PLACEMENT (IOC);  Surgeon: Gemma PayorKerry Hunt, MD;  Location: [facility];  Service: Ophthalmology;  Laterality: Right;  CDE:7.76  . TUBAL LIGATION      OB History    No data available       Home Medications    Prior to Admission medications   Medication Sig Start Date End Date Taking? Authorizing Provider  amoxicillin (AMOXIL) 500 MG capsule Take 500 mg by mouth 3 (three) times daily.   Yes Historical Provider, MD  FLUoxetine (PROZAC) 20 MG tablet Take 1 tablet (20 mg total) by mouth daily. 02/02/16  Yes [name] Daphine DeutscherMartin, FNP    EPINEPHrine 0.15 MG/0.15ML IJ injection Inject 0.15 mLs (0.15 mg total) into the muscle as needed for anaphylaxis. 02/02/16   [name] Daphine DeutscherMartin, FNP    Family History No family history on file.  Social History Social History  Substance Use Topics  . Smoking status: Never Smoker  . Smokeless tobacco: Never Used  . Alcohol use Yes     Comment: wine occasionally     Allergies   Mango flavor and Meperidine hcl   Review of Systems Review of Systems  Constitutional: Negative for fever.  Respiratory: Positive for cough. Negative for shortness of breath.   Cardiovascular: Positive for chest pain.  Gastrointestinal: Positive for nausea. Negative for abdominal pain and vomiting.  All other systems reviewed and are negative.    Physical Exam Updated Vital Signs BP (!) 146/112 (BP Location: Left Arm)   Pulse 65   Temp 97.9 F (36.6 C) (Oral)   Resp 20   Ht 5\' 2"  (1.575 m)   Wt 150 lb (68 kg)   SpO2 100%   BMI 27.44 kg/m   Physical Exam  Constitutional: She is oriented to person, place, and time. She appears well-developed and well-nourished.  HENT:  Head: Normocephalic and atraumatic.  Cardiovascular: Normal rate, regular rhythm and normal heart sounds.   No murmur heard. Pulmonary/Chest: Effort normal and breath sounds normal. No respiratory distress. She has no wheezes.  Abdominal: Soft. Bowel sounds are  normal. There is no tenderness. There is no guarding.  Musculoskeletal: She exhibits no edema.  Neurological: She is alert and oriented to person, place, and time.  Skin: Skin is warm. She is diaphoretic.  Bites noted bilateral lower extremities  Psychiatric: She has a normal mood and affect.  Nursing note and vitals reviewed.    ED Treatments / Results  Labs (all labs ordered are listed, but only abnormal results are displayed) Labs Reviewed  BASIC METABOLIC PANEL - Abnormal; Notable for the following:       Result Value   Glucose, Bld 105 (*)    All  other components within normal limits  CBC WITH DIFFERENTIAL/PLATELET  TROPONIN I    EKG  EKG Interpretation  Date/Time:  Thursday July 29 2016 01:17:25 EDT Ventricular Rate:  63 PR Interval:    QRS Duration: 97 QT Interval:  410 QTC Calculation: 420 R Axis:   85 Text Interpretation:  Sinus rhythm Low voltage, precordial leads No significant change since last tracing Confirmed by Keyontay Stolz  MD, Derica Leiber ([id number]) on 07/29/2016 2:07:22 AM       Radiology Dg Chest 2 View  Result Date: 07/29/2016 CLINICAL DATA:  Midsternal chest pain for 1 hour.  Nausea. EXAM: CHEST  2 VIEW COMPARISON:  01/04/2011 FINDINGS: The cardiomediastinal contours are normal. The lungs are clear. Pulmonary vasculature is normal. No consolidation, pleural effusion, or pneumothorax. No acute osseous abnormalities are seen. IMPRESSION: No active cardiopulmonary disease. Electronically Signed   By: Rubye Oaks M.D.   On: 07/29/2016 01:45    Procedures Procedures (including critical care time)  Medications Ordered in ED Medications  aspirin chewable tablet 81 mg (not administered)  nitroGLYCERIN (NITROGLYN) 2 % ointment 1 inch (not administered)  morphine 4 MG/ML injection 4 mg (not administered)     Initial Impression / Assessment and Plan / ED Course  I have reviewed the triage vital signs and the nursing notes.  Pertinent labs & imaging results that were available during my care of the patient were reviewed by me and considered in my medical decision making (see chart for details).  Clinical Course    Patient presents with chest pain. Ongoing for one hour. History is highly suspicious for ACS. Reports prior cardiac event. Negative stress testing in our system. EKG is nonischemic. Patient was given one additional aspirin, nitroglycerin ointment, and morphine for pain. Lab work obtained. Initial troponin negative.  2:17 AM On recheck, patient reports complete resolution of chest pain. Heart score  is 4. Will admit for serial enzymes and cardiology evaluation in the morning.    Final Clinical Impressions(s) / ED Diagnoses   Final diagnoses:  Chest pain, unspecified chest pain type    New Prescriptions New Prescriptions   No medications on file     Shon Baton, MD 07/29/16 [phone]

## 2016-07-29 NOTE — Consult Note (Signed)
CARDIOLOGY CONSULT NOTE   Patient ID: [name] MRN: [id number] DOB/AGE: 12/10/1960 55 y.o.  Admit Date: 07/29/2016 Referring Physician: TRH-Patel MD Primary Physician: Chevis Pretty, FNP Consulting Cardiologist: Carlyle Dolly MD Primary Cardiologist: Julio Sicks MD Reason for Consultation: Unstable Angina  Clinical Summary [name] is a 55 y.o.female who was last seen by Dr. Percival Spanish in March 2012 for atypical chest pain and was scheduled for GXT. Picking up on results of this on review of past medical records. The patient reports that the stress test was normal. She did have an echocardiogram in February 2012 which demonstrated EF of 50-55%, with no regional wall motion abnormalities. Left ventricular diastolic function parameters were normal. She was diagnosed with anxiety and depression. She has not been seen by cardiology since that time.  The patient was in her usual state of health when she awoke at 12:45 AM with chest pressure from her sternum up into her jaw and radiating down the right arm. Patient to get aspirin sat try to medicate and pray with no results after approximately 30-45 minutes. She awoke her husband asked her to bring her to the emergency room. She denies any heavy lifting, exertional activity earlier in the week, or recent stress.  Patient came to ER with complaints of chest pain. Arrival patient's blood pressure 146 of 112 heart rate 65 and O2 sat 100% she was afebrile. She was not found to be anemic, he met was unremarkable, chest x-ray was negative for active cardiopulmonary disease. EKG revealed normal sinus rhythm without evidence of ACS. Troponins negative 2. She was admitted to rule out cardiac etiology for chest discomfort.  She is currently without complaint resting but can feel frequent palpitations.  Allergies  Allergen Reactions  . Mango Flavor Anaphylaxis  . Meperidine Hcl Other (See Comments)    SYNCOPE     Medications Scheduled Medications: . aspirin  325 mg Oral Daily  . FLUoxetine  20 mg Oral Daily  . pantoprazole  40 mg Oral BID AC  . sucralfate  1 g Oral TID WC & HS    PRN Medications: acetaminophen, alum & mag hydroxide-simeth   Past Medical History:  Diagnosis Date  . Cataract   . Heart attack ([place])   . Idiopathic anaphylactic reaction     Past Surgical History:  Procedure Laterality Date  . ABDOMINAL HYSTERECTOMY    . CATARACT EXTRACTION W/PHACO Left 12/24/2013   Procedure: CATARACT EXTRACTION PHACO AND INTRAOCULAR LENS PLACEMENT (IOC);  Surgeon: Tonny Branch, MD;  Location: [facility];  Service: Ophthalmology;  Laterality: Left;  CDE:  10.84  . CATARACT EXTRACTION W/PHACO Right 01/07/2014   Procedure: CATARACT EXTRACTION PHACO AND INTRAOCULAR LENS PLACEMENT (IOC);  Surgeon: Tonny Branch, MD;  Location: [facility];  Service: Ophthalmology;  Laterality: Right;  CDE:7.76  . TUBAL LIGATION      Family History  Problem Relation Age of Onset  . Graves' disease Mother   . CAD Father      Social History [name] reports that she has never smoked. She has never used smokeless tobacco. [name] reports that she drinks alcohol.  Review of Systems Complete review of systems are found to be negative unless outlined in H&P above.  Physical Examination Blood pressure (!) 96/54, pulse 62, temperature 97.8 F (36.6 C), temperature source Oral, resp. rate 12, height '5\' 2"'  (1.575 m), weight 150 lb 1.6 oz (68.1 kg), SpO2 99 %.  Intake/Output Summary (Last 24 hours) at 07/29/16 1211 Last data filed  at 07/29/16 0835  Gross per 24 hour  Intake                0 ml  Output              300 ml  Net             -300 ml    Telemetry:Normal sinus rhythm with rare PACs, some bradycardia overnight into the 50s, no pauses.   GEN: No acute distress resting comfortably in bed. HEENT: Conjunctiva and lids normal, oropharynx clear with moist mucosa. Neck: Supple, no elevated JVP or carotid  bruits, no thyromegaly. Lungs: Clear to auscultation, nonlabored breathing at rest. Cardiac: Regular rate and rhythm, no S3 or significant systolic murmur, no pericardial rub. Abdomen: Soft, nontender, no hepatomegaly, bowel sounds present, no guarding or rebound. Extremities: No pitting edema, distal pulses 2+. Skin: Warm and dry. Musculoskeletal: No kyphosis. Neuropsychiatric: Alert and oriented x3, affect grossly appropriate.  Prior Cardiac Testing/Procedures Echocardiogram 2012: (Transcribed from scanned document)  Study conclusions: Left ventricle: Cavity size is normal. Wall thickness was normal. Systolic function was normal. The estimated ejection fraction was in the range of 50% to 55%. Wall motion was normal, there were no regional wall motion abnormalities. Left ventricular diastolic function parameters were normal.  GXT 2012 Reportedly normal.  Lab Results  Basic Metabolic Panel:  Recent Labs Lab 07/29/16 0121  NA 139  K 3.9  CL 102  CO2 28  GLUCOSE 105*  BUN 18  CREATININE 0.86  CALCIUM 9.5    CBC:  Recent Labs Lab 07/29/16 0121  WBC 6.6  NEUTROABS 2.7  HGB 13.4  HCT 39.9  MCV 91.3  PLT 311    Cardiac Enzymes:  Recent Labs Lab 07/29/16 0121 07/29/16 0744  TROPONINI <0.03 <0.03     Radiology: Dg Chest 2 View  Result Date: 07/29/2016 CLINICAL DATA:  Midsternal chest pain for 1 hour.  Nausea. EXAM: CHEST  2 VIEW COMPARISON:  01/04/2011 FINDINGS: The cardiomediastinal contours are normal. The lungs are clear. Pulmonary vasculature is normal. No consolidation, pleural effusion, or pneumothorax. No acute osseous abnormalities are seen. IMPRESSION: No active cardiopulmonary disease. Electronically Signed   By: Jeb Levering M.D.   On: 07/29/2016 01:45     UPJ:SRPRXY sinus rhythm   Impression and Recommendations  1. Chest pain: Awakening her at night noted substernally radiating to her jaw and right arm. Troponin and EKG have ruled out ACS.  Cardiovascular risk factors family history of coronary artery disease. Can consider outpatient stress test for diagnostic prognostic purposes with recurrent chest pain. Uncertain if this is unstable angina, but symptoms are typical. The patient would like to go home and have outpatient stress test. Will discuss with Dr. Harl Bowie concerning timing of inpatient versus outpatient  2. Mild bradycardia: Noted on telemetry overnight. No pauses or significant heart rate drops. Can consider outpatient cardiac monitor if this becomes more of a concern as etiology of chest discomfort. She is not on any AV nodal blocking agents. TSH 1.860.  3. Depression: Had been placed on SSRI approximate 5 years ago while carrying for her dying husband and she has remained on it to date.      Signed: Phill Myron. Lawrence NP [place]  07/29/2016, 12:11 PM Co-Sign MD  Patient seen and discussed with NP Purcell Nails, I agree with her documentation above. 55 yo female with history of chest pain with normal GXT in 01/2011, DM2, HL admitted with chest pain. Symptoms awoke her from  sleep a little after midnight. Pressure like pain from midepgastrium up into mid chest. +SOB. Symptoms better with laying prone. Lasted total of 3 hours contionously. Similar symptoms to what she had 5 years ago, at that time she had a negative exercise stress.   Hgb 13.4, Plt 311, K 3.9, Cr 0.86, TSH 1.86, trop neg x 2.  CXR no acute process  EKG SR no ischemic changes Echo pending  Fairly atypical symptoms, symptoms were positional and lasted 3 hrs constantly. No objective evidence of ischemia thus far, though she has not complete her rule out yet. She is anxous to go home. If f/u troponin, EKG, and echo this afternoon are ok would plan for discharge with outpatient GXT.   Zandra Abts MD

## 2016-07-29 NOTE — Progress Notes (Signed)
PT stable upon discharge. Discharge instructions, medications and follow up appt reviewed. IV removed, catheter intact. Telemetry removed. Belongings packed and taken with the Pt. Pt transported by husband to home. One staff member accompanied PT to car. PT verbalizes understanding.

## 2016-07-29 NOTE — ED Notes (Signed)
Report given to RN on 300 

## 2016-07-29 NOTE — Progress Notes (Signed)
*  PRELIMINARY RESULTS* Echocardiogram 2D Echocardiogram has been performed.  [name], [name] 07/29/2016, 2:02 PM

## 2016-07-30 LAB — HEMOGLOBIN A1C
Hgb A1c MFr Bld: 5.5 % (ref 4.8–5.6)
MEAN PLASMA GLUCOSE: 111 mg/dL

## 2016-08-01 DIAGNOSIS — K2960 Other gastritis without bleeding: Secondary | ICD-10-CM | POA: Diagnosis present

## 2016-08-01 DIAGNOSIS — T50905A Adverse effect of unspecified drugs, medicaments and biological substances, initial encounter: Secondary | ICD-10-CM | POA: Diagnosis present

## 2016-08-01 NOTE — Discharge Summary (Signed)
[facility] Discharge Summary   Patient: [name]:[id number]   PCP: Bennie Pierini, FNP DOB: 1961/02/12   Date of admission: 07/29/2016   Date of discharge: 07/29/2016    Discharge Diagnoses:  Principal Problem:   Atypical chest pain Active Problems:   Hyperlipidemia  Admitted From: Home Disposition:  Home  Recommendations for Outpatient Follow-up:  1. Follow-up with PCP in one week. 2. Follow-up with cardiology for outpatient stress test   Follow-up Information    [name] Daphine Deutscher, FNP. Schedule an appointment as soon as possible for a visit in 1 week(s).   Specialty:  Family Medicine Contact information: [address] [place] [address] [phone]        Dina Rich, MD. Schedule an appointment as soon as possible for a visit in 3 week(s).   Specialty:  Cardiology Contact information: [address] [place] [address] [phone]          Diet recommendation: Cardiac diet  Activity: The patient is advised to gradually reintroduce usual activities.  Discharge Condition: good  Code Status: Full code  History of present illness: As per the H and P dictated on admission, "[name] is a 55 y.o. female with a past medical history significant for diabetes and HDL  presented with chest pain that began one hour ago. She states this chest pain woke her up. She states this chest pain felt tight and radiates to her jaw. She has associated diaphoresis and coughing. She denies nausea, vomiting, diarrhea, melena, and abdominal pain. While in the ED, she was given  ASA, NTG ointment, and morphine which improved her chest pain. She was admitted for further observation of chest pain.  She had no exertional CP, and five years ago, had stress test, details unclear. "  Hospital Course:  Summary of her active problems in the hospital is as following. Principal Problem:   Atypical chest pain Active Problems:   Hyperlipidemia   Pill-induced gastritis She presented with complains of chest pain. The pain was reproducible. Cardiology was consulted. Serial troponins were negative. Echocardiogram did not show any evidence of systolic dysfunction or wall motion abnormality. With this cardiogenic recommended that the patient can be safely discharged home with outpatient follow-up for possible stress test. Patient will be discharged on nitroglycerin.  Suspected Pill-induced gastritis. Patient's pain was epigastric in location and started after a course of doxycycline. This was also associated with dry cough. With this patient is highly likely to have a pill-induced gastritis. At present I would recommend a short course of PPI as well as Carafate.  All other chronic medical condition were stable during the hospitalization.  Patient was ambulatory without any assistance. On the day of the discharge the patient's vitals are stable, and no other acute medical condition were reported by patient. the patient was felt safe to be discharge at home with family.  Procedures and Results:  Echocardiogram  Study Conclusions  - Left ventricle: The cavity size was normal. Wall thickness was   increased in a pattern of mild LVH. Systolic function was normal.   The estimated ejection fraction was in the range of 60% to 65%.   Wall motion was normal; there were no regional wall motion   abnormalities. Left ventricular diastolic function parameters   were normal. - Aortic valve: Valve area (VTI): 2.48 cm^2. Valve area (Vmax):   2.54 cm^2. - Atrial septum: No defect or patent foramen ovale was identified. - Technically adequate study.  Consultations:  Cardiology  DISCHARGE MEDICATION: Discharge Medication List as of 07/29/2016  3:22 PM    START taking these medications   Details  alum & mag hydroxide-simeth (MAALOX/MYLANTA) 200-200-20 MG/5ML suspension Take 15 mLs by mouth every 4 (four) hours as needed for indigestion or  heartburn., Starting Thu 07/29/2016, Normal    nitroGLYCERIN (NITROSTAT) 0.4 MG SL tablet Place 1 tablet (0.4 mg total) under the tongue every 5 (five) minutes as needed for chest pain., Starting Thu 07/29/2016, Normal    pantoprazole (PROTONIX) 40 MG tablet Take 1 tablet (40 mg total) by mouth 2 (two) times daily before a meal., Starting Thu 07/29/2016, Until Thu 08/12/2016, Normal    sucralfate (CARAFATE) 1 g tablet Take 1 tablet (1 g total) by mouth 4 (four) times daily -  with meals and at bedtime., Starting Thu 07/29/2016, Until Thu 08/12/2016, Normal      CONTINUE these medications which have NOT CHANGED   Details  aspirin EC 81 MG tablet Take 81 mg by mouth daily., Historical Med    EPINEPHrine 0.15 MG/0.15ML IJ injection Inject 0.15 mLs (0.15 mg total) into the muscle as needed for anaphylaxis., Starting Mon 02/02/2016, Normal    FLUoxetine (PROZAC) 20 MG tablet Take 1 tablet (20 mg total) by mouth daily., Starting Mon 02/02/2016, Normal    Pediatric Multiple Vit-C-FA (PEDIATRIC MULTIVITAMIN) chewable tablet Chew 1 tablet by mouth daily., Historical Med       Allergies  Allergen Reactions  . Mango Flavor Anaphylaxis  . Meperidine Hcl Other (See Comments)    SYNCOPE   Discharge Instructions    Diet - low sodium heart healthy    Complete by:  As directed    Discharge instructions    Complete by:  As directed    It is important that you read following instructions as well as go over your medication list with RN to help you understand your care after this hospitalization.  Discharge Instructions: Please follow-up with PCP in one week  Please request your primary care physician to go over all Hospital Tests and Procedure/Radiological results at the follow up,  Please get all Hospital records sent to your PCP by signing hospital release before you go home.   Do not take more than prescribed Pain, Sleep and Anxiety Medications. You were cared for by a hospitalist during your  hospital stay. If you have any questions about your discharge medications or the care you received while you were in the hospital after you are discharged, you can call the unit and ask to speak with the hospitalist on call if the hospitalist that took care of you is not available.  Once you are discharged, your primary care physician will handle any further medical issues. Please note that NO REFILLS for any discharge medications will be authorized once you are discharged, as it is imperative that you return to your primary care physician (or establish a relationship with a primary care physician if you do not have one) for your aftercare needs so that they can reassess your need for medications and monitor your lab values. You Must read complete instructions/literature along with all the possible adverse reactions/side effects for all the Medicines you take and that have been prescribed to you. Take any new Medicines after you have completely understood and accept all the possible adverse reactions/side effects. Wear Seat belts while driving. If you have smoked or chewed Tobacco in the last 2 yrs please stop smoking and/or stop any Recreational drug use.   Increase activity slowly  Complete by:  As directed      Discharge Exam: Filed Weights   07/29/16 0108 07/29/16 1610  Weight: 68 kg (150 lb) 68.1 kg (150 lb 1.6 oz)   Vitals:   07/29/16 0638 07/29/16 1607  BP: (!) 96/54 107/60  Pulse: 62 68  Resp: 12 12  Temp: 97.8 F (36.6 C) 98.3 F (36.8 C)   General: Appear in no distress, no Rash; Oral Mucosa moist. Cardiovascular: S1 and S2 Present, no Murmur, no JVD Respiratory: Bilateral Air entry present and Clear to Auscultation, no Crackles, no wheezes Abdomen: Bowel Sound present, Soft and no tenderness Extremities: no Pedal edema, no calf tenderness Neurology: Grossly no focal neuro deficit.  The results of significant diagnostics from this hospitalization (including imaging,  microbiology, ancillary and laboratory) are listed below for reference.    Significant Diagnostic Studies: Dg Chest 2 View  Result Date: 07/29/2016 CLINICAL DATA:  Midsternal chest pain for 1 hour.  Nausea. EXAM: CHEST  2 VIEW COMPARISON:  01/04/2011 FINDINGS: The cardiomediastinal contours are normal. The lungs are clear. Pulmonary vasculature is normal. No consolidation, pleural effusion, or pneumothorax. No acute osseous abnormalities are seen. IMPRESSION: No active cardiopulmonary disease. Electronically Signed   By: Rubye Oaks M.D.   On: 07/29/2016 01:45    Microbiology: No results found for this or any previous visit (from the past 240 hour(s)).   Labs: CBC:  Recent Labs Lab 07/29/16 0121  WBC 6.6  NEUTROABS 2.7  HGB 13.4  HCT 39.9  MCV 91.3  PLT 311   Basic Metabolic Panel:  Recent Labs Lab 07/29/16 0121  NA 139  K 3.9  CL 102  CO2 28  GLUCOSE 105*  BUN 18  CREATININE 0.86  CALCIUM 9.5   Liver Function Tests: No results for input(s): AST, ALT, ALKPHOS, BILITOT, PROT, ALBUMIN in the last 168 hours. No results for input(s): LIPASE, AMYLASE in the last 168 hours. No results for input(s): AMMONIA in the last 168 hours. Cardiac Enzymes:  Recent Labs Lab 07/29/16 0121 07/29/16 0744 07/29/16 1325  TROPONINI <0.03 <0.03 <0.03   BNP (last 3 results) No results for input(s): BNP in the last 8760 hours. CBG: No results for input(s): GLUCAP in the last 168 hours. Time spent: 30 minutes  Signed:  Lynden Oxford  [facility] 07/29/2016  5:41 PM

## 2016-08-06 ENCOUNTER — Encounter: Payer: Self-pay | Admitting: Nurse Practitioner

## 2016-08-06 ENCOUNTER — Ambulatory Visit (INDEPENDENT_AMBULATORY_CARE_PROVIDER_SITE_OTHER): Payer: BC Managed Care – PPO | Admitting: Nurse Practitioner

## 2016-08-06 VITALS — BP 109/75 | HR 85 | Temp 97.7°F | Ht 62.0 in | Wt 154.0 lb

## 2016-08-06 DIAGNOSIS — K130 Diseases of lips: Secondary | ICD-10-CM

## 2016-08-06 DIAGNOSIS — R220 Localized swelling, mass and lump, head: Secondary | ICD-10-CM

## 2016-08-06 DIAGNOSIS — Z09 Encounter for follow-up examination after completed treatment for conditions other than malignant neoplasm: Secondary | ICD-10-CM | POA: Diagnosis not present

## 2016-08-06 NOTE — Progress Notes (Signed)
   Subjective:    Patient ID: [name], female    DOB: [birth year], 55 y.o.   MRN: [id number]  HPI Patient comes in today for hospital follow up- she went to hospital with chest pain- they kept her for several days and ran a bunch of test. All heart test were negative including troponins. She was eventually diagnosed with "pill" induced gastritis from taking doxycycline  That was given to her for lyme disease prevention. SHe is doing much better- no reports of chest pain. SHe is however scheduled for stress test next week. She also says that she is waking up with lip swelling. She says she will take a benadryl and it will eventually go down. She has had lots of tick bites.   Review of Systems  Constitutional: Negative.   HENT: Negative.   Respiratory: Negative.   Cardiovascular: Negative.   Genitourinary: Negative.   Neurological: Negative.   Psychiatric/Behavioral: Negative.   All other systems reviewed and are negative.      Objective:   Physical Exam  Constitutional: She is oriented to person, place, and time. She appears well-developed and well-nourished.  Cardiovascular: Normal rate, normal heart sounds and intact distal pulses.   Pulmonary/Chest: Effort normal and breath sounds normal.  Abdominal: Soft.  Neurological: She is alert and oriented to person, place, and time.  Skin: Skin is warm.  Psychiatric: She has a normal mood and affect. Her behavior is normal. Judgment and thought content normal.   BP 109/75   Pulse 85   Temp 97.7 F (36.5 C) (Oral)   Ht 5\' 2"  (1.575 m)   Wt 154 lb (69.9 kg)   BMI 28.17 kg/m         Assessment & Plan:  1. Hospital discharge follow-up Hospital records reviewed  2. Lip swelling Keep diary of foods eaten and when swelling occurs Keep benadryl and epi pen avail at all times - Lyme Ab/Western Blot Reflex - Alpha-Gal Panel  [name] Daphine DeutscherMartin, FNP

## 2016-08-11 LAB — ALPHA-GAL PANEL
Alpha Gal IgE*: 31.2 kU/L — ABNORMAL HIGH (ref <0.35)
BEEF (BOS SPP) IGE: 9.72 kU/L — AB (ref <0.35)
BEEF CLASS INTERPRETATION: 3
Class Interpretation: 2
LAMB/MUTTON (OVIS SPP) IGE: 2.01 kU/L — AB (ref <0.35)
PORK CLASS INTERPRETATION: 3
Pork (Sus spp) IgE: 5.1 kU/L — ABNORMAL HIGH (ref <0.35)

## 2016-08-11 LAB — LYME AB/WESTERN BLOT REFLEX

## 2016-08-13 ENCOUNTER — Telehealth: Payer: Self-pay | Admitting: Nurse Practitioner

## 2016-08-13 ENCOUNTER — Other Ambulatory Visit: Payer: Self-pay | Admitting: Nurse Practitioner

## 2016-08-13 ENCOUNTER — Encounter (HOSPITAL_COMMUNITY): Payer: BC Managed Care – PPO

## 2016-08-13 ENCOUNTER — Ambulatory Visit (HOSPITAL_COMMUNITY)
Admission: RE | Admit: 2016-08-13 | Discharge: 2016-08-13 | Disposition: A | Discharge status: Home / Self Care | Payer: BC Managed Care – PPO | Source: Ambulatory Visit | Attending: Cardiology | Admitting: Cardiology

## 2016-08-13 DIAGNOSIS — Z91018 Allergy to other foods: Secondary | ICD-10-CM

## 2016-08-13 DIAGNOSIS — R0789 Other chest pain: Secondary | ICD-10-CM | POA: Insufficient documentation

## 2016-08-13 LAB — EXERCISE TOLERANCE TEST
CSEPED: 6 min
CSEPEW: 7.8 METS
CSEPPHR: 150 {beats}/min
Exercise duration (sec): 18 s
MPHR: 165 {beats}/min
Percent HR: 90 %
RPE: 14
Rest HR: 61 {beats}/min

## 2016-08-13 NOTE — Telephone Encounter (Signed)
Patient aware that referral has been placed.  

## 2016-08-13 NOTE — Telephone Encounter (Signed)
An allergist will not be ale to do anything about alpha gal

## 2016-08-13 NOTE — Progress Notes (Signed)
Allergist referral made

## 2016-08-13 NOTE — Telephone Encounter (Signed)
Patient aware of results.

## 2016-08-13 NOTE — Telephone Encounter (Signed)
Patient wants a referral to a Allergist.

## 2016-08-13 NOTE — Telephone Encounter (Signed)
Referral made 

## 2016-08-16 ENCOUNTER — Other Ambulatory Visit: Payer: Self-pay

## 2016-08-16 DIAGNOSIS — R9439 Abnormal result of other cardiovascular function study: Secondary | ICD-10-CM

## 2016-08-16 NOTE — Progress Notes (Signed)
Per Dr. Wyline MoodBranch. To order a lexi.

## 2016-08-20 ENCOUNTER — Encounter: Payer: Self-pay | Admitting: Adult Health

## 2016-08-20 ENCOUNTER — Ambulatory Visit (INDEPENDENT_AMBULATORY_CARE_PROVIDER_SITE_OTHER): Payer: BC Managed Care – PPO | Admitting: Adult Health

## 2016-08-20 VITALS — BP 140/73 | HR 72 | Ht 62.0 in | Wt 154.0 lb

## 2016-08-20 DIAGNOSIS — R079 Chest pain, unspecified: Secondary | ICD-10-CM | POA: Diagnosis not present

## 2016-08-20 DIAGNOSIS — E756 Lipid storage disorder, unspecified: Secondary | ICD-10-CM

## 2016-08-20 DIAGNOSIS — E8809 Other disorders of plasma-protein metabolism, not elsewhere classified: Secondary | ICD-10-CM

## 2016-08-20 DIAGNOSIS — R9439 Abnormal result of other cardiovascular function study: Secondary | ICD-10-CM

## 2016-08-20 NOTE — Progress Notes (Signed)
Cardiology Office Note   Date:  08/20/2016   ID:  [name], DOB 28-Jun-1961, MRN [id number]  PCP:  Bennie PieriniMary-Margaret Martin, FNP  Cardiologist: Arlington CalixBranch/  Selinda Korzeniewski, NP   No chief complaint on file.     History of Present Illness: [name] is a 55 y.o. female who presents for ongoing assessment and management of atypical chest pain, with history of hyperipidemia. Other history includes diabetes. The patient was seen in the hospital on consultation in September 2017 with complaints of chest tightness radiating to her jaw associated with diaphoresis and coughing. The pain was not found to be cardiac in etiology. Echocardiogram did not show any evidence of systolic dysfunction or wall motion abnormalities. She was diagnosed with pill-induced gastritis. She had been on a course of doxycycline.  She goes today with ongoing discomfort in her chest. She did have a GXT which was nonspecific. Was recommended that she have a nuclear medicine stress test on follow-up for more diagnostic evaluation. Since being seen last she has been diagnosed with Alpha-Gal syndrome. 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  Procedures and Results:  Echocardiogram  Study Conclusions  - Left ventricle: The cavity size was normal. Wall thickness was increased in a pattern of mild LVH. Systolic function was normal. The estimated ejection fraction was in the range of 60% to 65%. Wall motion was normal; there were no regional wall motion abnormalities. Left ventricular diastolic function parameters were normal. - Aortic valve: Valve area (VTI): 2.48 cm^2. Valve area (Vmax): 2.54 cm^2. - Atrial septum: No defect or patent foramen ovale was identified. - Technically adequate study.   Past Medical History:  Diagnosis Date  . Cataract   . Heart attack   . Idiopathic anaphylactic reaction     Past Surgical History:  Procedure Laterality Date  . ABDOMINAL HYSTERECTOMY    .  CATARACT EXTRACTION W/PHACO Left 12/24/2013   Procedure: CATARACT EXTRACTION PHACO AND INTRAOCULAR LENS PLACEMENT (IOC);  Surgeon: Gemma PayorKerry Hunt, MD;  Location: [facility];  Service: Ophthalmology;  Laterality: Left;  CDE:  10.84  . CATARACT EXTRACTION W/PHACO Right 01/07/2014   Procedure: CATARACT EXTRACTION PHACO AND INTRAOCULAR LENS PLACEMENT (IOC);  Surgeon: Gemma PayorKerry Hunt, MD;  Location: [facility];  Service: Ophthalmology;  Laterality: Right;  CDE:7.76  . TUBAL LIGATION       Current Outpatient Prescriptions  Medication Sig Dispense Refill  . aspirin EC 81 MG tablet Take 81 mg by mouth daily.    Marland Kitchen. EPINEPHrine 0.15 MG/0.15ML IJ injection Inject 0.15 mLs (0.15 mg total) into the muscle as needed for anaphylaxis. 2 Device 3  . FLUoxetine (PROZAC) 20 MG tablet Take 1 tablet (20 mg total) by mouth daily. 90 tablet 1  . nitroGLYCERIN (NITROSTAT) 0.4 MG SL tablet Place 1 tablet (0.4 mg total) under the tongue every 5 (five) minutes as needed for chest pain. 30 tablet 0  . Pediatric Multiple Vit-C-FA (PEDIATRIC MULTIVITAMIN) chewable tablet Chew 1 tablet by mouth daily.     No current facility-administered medications for this visit.     Allergies:   Mango flavor and Meperidine hcl    Social History:  The patient  reports that she has never smoked. She has never used smokeless tobacco. She reports that she drinks alcohol. She reports that she does not use drugs.   Family History:  The patient's family history includes CAD in her father; [name] in her mother.    ROS: All other systems are reviewed and negative.  Unless otherwise mentioned in H&P    PHYSICAL EXAM: VS:  There were no vitals taken for this visit. , BMI There is no height or weight on file to calculate BMI. GEN: Well nourished, well developed, in no acute distress  HEENT: normal  Neck: no JVD, carotid bruits, or masses Cardiac: RRR; no murmurs, rubs, or gallops,no edema  Respiratory:  clear to auscultation bilaterally, normal work  of breathing GI: soft, nontender, nondistended, + BS MS: no deformity or atrophy  Skin: warm and dry, no rash Neuro:  Strength and sensation are intact Psych: euthymic mood, full affect   Recent Labs: 02/02/2016: ALT 31 07/29/2016: BUN 18; Creatinine, Ser 0.86; Hemoglobin 13.4; Platelets 311; Potassium 3.9; Sodium 139; TSH 1.860    Lipid Panel    Component Value Date/Time   CHOL 244 (H) 07/29/2016 0744   CHOL 251 (H) 02/02/2016 1153   TRIG 115 07/29/2016 0744   HDL 69 07/29/2016 0744   HDL 82 02/02/2016 1153   CHOLHDL 3.5 07/29/2016 0744   VLDL 23 07/29/2016 0744   LDLCALC 152 (H) 07/29/2016 0744   LDLCALC 152 (H) 02/02/2016 1153      Wt Readings from Last 3 Encounters:  08/06/16 154 lb (69.9 kg)  07/29/16 150 lb 1.6 oz (68.1 kg)  02/02/16 155 lb (70.3 kg)    ASSESSMENT AND PLAN:  1. Recurrent chest pain: Patient had abnormal GXT with recurrent chest discomfort. She's been recommended for nuclear medicine stress test for ongoing evaluation and better diagnostics. This will be ordered today. I've explained the test to her and have reassured her that there are no beef proteins within the NM radiotracer.   2. Hypertension: Noted on stress test. We'll continue to monitor. Blood pressure is stable currently.   Current medicines are reviewed at length with the patient today.    Labs/ tests ordered today include: Nuclear medicine exercise stress test  No orders of the defined types were placed in this encounter.    Disposition:   FU with post stress test to discuss results. Signed, Joni Reining, NP  08/20/2016 7:16 AM    [facility] [address], [place], [place] [address] Phone: [phone]; Fax: [phone]

## 2016-08-20 NOTE — Patient Instructions (Signed)
Your physician recommends that you schedule a follow-up appointment in: after test  Your physician has requested that you have en exercise stress myoview. For further information please visit [email or web address]. Please follow instruction sheet, as given.     Your physician recommends that you continue on your current medications as directed. Please refer to the Current Medication list given to you today.     Thank you for choosing [facility] !

## 2016-08-20 NOTE — Progress Notes (Signed)
Name: [name]    DOB: 05-Jul-1961  Age: 55 y.o.  MR#: [id number]       PCP:  Bennie Pierini, FNP      Insurance: Payor: BLUE CROSS BLUE [employer]IELD / Plan: [facility] HEALTH PPO / Product Type: *No Product type* /   CC:   No chief complaint on file.   VS Vitals:   08/20/16 1308  BP: 140/73  Pulse: 72  SpO2: 98%  Weight: 154 lb (69.9 kg)  Height: 5\' 2"  (1.575 m)    Weights Current Weight  08/20/16 154 lb (69.9 kg)  08/06/16 154 lb (69.9 kg)  07/29/16 150 lb 1.6 oz (68.1 kg)    Blood Pressure  BP Readings from Last 3 Encounters:  08/20/16 140/73  08/06/16 109/75  07/29/16 107/60     Admit date:  (Not on file) Last encounter with RMR:  Visit date not found   Allergy Mango flavor and Meperidine hcl  Current Outpatient Prescriptions  Medication Sig Dispense Refill  . aspirin EC 81 MG tablet Take 81 mg by mouth daily.    . cetirizine (ZYRTEC) 10 MG tablet Take 10 mg by mouth as needed for allergies.    Marland Kitchen EPINEPHrine 0.15 MG/0.15ML IJ injection Inject 0.15 mLs (0.15 mg total) into the muscle as needed for anaphylaxis. 2 Device 3  . FLUoxetine (PROZAC) 20 MG tablet Take 1 tablet (20 mg total) by mouth daily. 90 tablet 1  . nitroGLYCERIN (NITROSTAT) 0.4 MG SL tablet Place 1 tablet (0.4 mg total) under the tongue every 5 (five) minutes as needed for chest pain. 30 tablet 0  . Pediatric Multiple Vit-C-FA (PEDIATRIC MULTIVITAMIN) chewable tablet Chew 1 tablet by mouth daily.     No current facility-administered medications for this visit.     Discontinued Meds:   There are no discontinued medications.  Patient Active Problem List   Diagnosis Date Noted  . Pill-induced gastritis 08/01/2016  . Unstable angina (HCC) 07/29/2016  . Hyperlipidemia 07/29/2016  . Annual physical exam 02/02/2016  . Anxiety state 01/15/2011  . Atypical chest pain 01/15/2011    LABS    Component Value Date/Time   NA 139 07/29/2016 0121   NA 141 02/02/2016 1153   NA 142 03/19/2014  1441   NA 143 12/20/2013 1510   NA 140 01/05/2011 0905   K 3.9 07/29/2016 0121   K 4.5 02/02/2016 1153   K 4.1 03/19/2014 1441   CL 102 07/29/2016 0121   CL 100 02/02/2016 1153   CL 103 03/19/2014 1441   CO2 28 07/29/2016 0121   CO2 24 02/02/2016 1153   CO2 25 03/19/2014 1441   GLUCOSE 105 (H) 07/29/2016 0121   GLUCOSE 67 02/02/2016 1153   GLUCOSE 97 03/19/2014 1441   GLUCOSE 107 (H) 12/20/2013 1510   GLUCOSE 91 01/05/2011 0905   BUN 18 07/29/2016 0121   BUN 18 02/02/2016 1153   BUN 17 03/19/2014 1441   BUN 21 12/20/2013 1510   BUN 7 01/05/2011 0905   CREATININE 0.86 07/29/2016 0121   CREATININE 0.68 02/02/2016 1153   CREATININE 0.86 03/19/2014 1441   CALCIUM 9.5 07/29/2016 0121   CALCIUM 9.6 02/02/2016 1153   CALCIUM 9.2 03/19/2014 1441   GFRNONAA >60 07/29/2016 0121   GFRNONAA 99 02/02/2016 1153   GFRNONAA 77 03/19/2014 1441   GFRAA >60 07/29/2016 0121   GFRAA 114 02/02/2016 1153   GFRAA 89 03/19/2014 1441   CMP     Component Value Date/Time   NA  139 07/29/2016 0121   NA 141 02/02/2016 1153   K 3.9 07/29/2016 0121   CL 102 07/29/2016 0121   CO2 28 07/29/2016 0121   GLUCOSE 105 (H) 07/29/2016 0121   BUN 18 07/29/2016 0121   BUN 18 02/02/2016 1153   CREATININE 0.86 07/29/2016 0121   CALCIUM 9.5 07/29/2016 0121   PROT 7.3 02/02/2016 1153   ALBUMIN 4.7 02/02/2016 1153   AST 29 02/02/2016 1153   ALT 31 02/02/2016 1153   ALKPHOS 81 02/02/2016 1153   BILITOT 0.5 02/02/2016 1153   GFRNONAA >60 07/29/2016 0121   GFRAA >60 07/29/2016 0121       Component Value Date/Time   WBC 6.6 07/29/2016 0121   WBC 7.0 02/02/2016 1153   WBC 7.2 01/05/2011 0905   WBC 7.8 01/04/2011 1725   HGB 13.4 07/29/2016 0121   HGB 14.3 12/20/2013 1510   HGB 13.8 01/05/2011 0905   HCT 39.9 07/29/2016 0121   HCT 41.1 02/02/2016 1153   HCT 42.3 12/20/2013 1510   HCT 41.0 01/05/2011 0905   MCV 91.3 07/29/2016 0121   MCV 89 02/02/2016 1153   MCV 91.9 01/05/2011 0905   MCV 90.6  01/04/2011 1725    Lipid Panel     Component Value Date/Time   CHOL 244 (H) 07/29/2016 0744   CHOL 251 (H) 02/02/2016 1153   TRIG 115 07/29/2016 0744   HDL 69 07/29/2016 0744   HDL 82 02/02/2016 1153   CHOLHDL 3.5 07/29/2016 0744   VLDL 23 07/29/2016 0744   LDLCALC 152 (H) 07/29/2016 0744   LDLCALC 152 (H) 02/02/2016 1153    ABG No results found for: PHART, PCO2ART, PO2ART, HCO3, TCO2, ACIDBASEDEF, O2SAT   Lab Results  Component Value Date   TSH 1.860 07/29/2016   BNP (last 3 results) No results for input(s): BNP in the last 8760 hours.  ProBNP (last 3 results) No results for input(s): PROBNP in the last 8760 hours.  Cardiac Panel (last 3 results) No results for input(s): CKTOTAL, CKMB, TROPONINI, RELINDX in the last 72 hours.  Iron/TIBC/Ferritin/ %Sat No results found for: IRON, TIBC, FERRITIN, IRONPCTSAT   EKG Orders placed or performed during the hospital encounter of 07/29/16  . EKG 12-Lead  . EKG 12-Lead  . EKG  . EKG 12-Lead  . EKG 12-Lead     Prior Assessment and Plan Problem List as of 08/20/2016 Reviewed: 08/06/2016  4:42 PM by Bennie PieriniMary-Margaret Martin, FNP     Cardiovascular and Mediastinum   Unstable angina [facility](HCC)     Digestive   Pill-induced gastritis     Other   Anxiety state   Atypical chest pain   Annual physical exam   Hyperlipidemia       Imaging: Dg Chest 2 View  Result Date: 07/29/2016 CLINICAL DATA:  Midsternal chest pain for 1 hour.  Nausea. EXAM: CHEST  2 VIEW COMPARISON:  01/04/2011 FINDINGS: The cardiomediastinal contours are normal. The lungs are clear. Pulmonary vasculature is normal. No consolidation, pleural effusion, or pneumothorax. No acute osseous abnormalities are seen. IMPRESSION: No active cardiopulmonary disease. Electronically Signed   By: Rubye OaksMelanie  Ehinger M.D.   On: 07/29/2016 01:45

## 2016-09-08 ENCOUNTER — Encounter (HOSPITAL_COMMUNITY)
Admission: RE | Admit: 2016-09-08 | Discharge: 2016-09-08 | Disposition: A | Discharge status: Home / Self Care | Payer: BC Managed Care – PPO | Source: Ambulatory Visit | Attending: Adult Health | Admitting: Adult Health

## 2016-09-08 ENCOUNTER — Inpatient Hospital Stay (HOSPITAL_COMMUNITY): Admission: RE | Admit: 2016-09-08 | Payer: BC Managed Care – PPO | Source: Ambulatory Visit

## 2016-09-08 ENCOUNTER — Encounter (HOSPITAL_COMMUNITY): Payer: Self-pay

## 2016-09-08 DIAGNOSIS — R079 Chest pain, unspecified: Secondary | ICD-10-CM | POA: Diagnosis not present

## 2016-09-08 DIAGNOSIS — R9439 Abnormal result of other cardiovascular function study: Secondary | ICD-10-CM | POA: Diagnosis not present

## 2016-09-08 LAB — NM MYOCAR MULTI W/SPECT W/WALL MOTION / EF
CHL CUP MPHR: 164 {beats}/min
CHL CUP NUCLEAR SDS: 1
CHL CUP RESTING HR STRESS: 64 {beats}/min
CSEPEDS: 30 s
CSEPHR: 100 %
CSEPPHR: 164 {beats}/min
Estimated workload: 10.1 METS
Exercise duration (min): 7 min
LVDIAVOL: 49 mL (ref 46–106)
LVSYSVOL: 12 mL
RATE: 0
RPE: 13
SRS: 0
SSS: 1
TID: 1.04

## 2016-09-08 MED ORDER — TECHNETIUM TC 99M TETROFOSMIN IV KIT
10.0000 | PACK | Freq: Once | INTRAVENOUS | Status: AC | PRN
  Administered 2016-09-08: 11 via INTRAVENOUS

## 2016-09-08 MED ORDER — REGADENOSON 0.4 MG/5ML IV SOLN
INTRAVENOUS | Status: DC
Start: 2016-09-08 — End: 2016-09-08
  Filled 2016-09-08: qty 5

## 2016-09-08 MED ORDER — SODIUM CHLORIDE 0.9% FLUSH
INTRAVENOUS | Status: AC
  Administered 2016-09-08: 10 mL via INTRAVENOUS
  Filled 2016-09-08: qty 10

## 2016-09-08 MED ORDER — TECHNETIUM TC 99M TETROFOSMIN IV KIT
30.0000 | PACK | Freq: Once | INTRAVENOUS | Status: AC | PRN
  Administered 2016-09-08: 29.8 via INTRAVENOUS

## 2016-09-09 ENCOUNTER — Encounter (HOSPITAL_COMMUNITY): Payer: BC Managed Care – PPO

## 2016-09-10 ENCOUNTER — Ambulatory Visit (INDEPENDENT_AMBULATORY_CARE_PROVIDER_SITE_OTHER): Payer: BC Managed Care – PPO | Admitting: Adult Health

## 2016-09-10 ENCOUNTER — Encounter: Payer: Self-pay | Admitting: Adult Health

## 2016-09-10 VITALS — BP 112/70 | HR 81 | Ht 62.5 in | Wt 151.0 lb

## 2016-09-10 DIAGNOSIS — R0789 Other chest pain: Secondary | ICD-10-CM | POA: Diagnosis not present

## 2016-09-10 NOTE — Patient Instructions (Signed)
Your physician recommends that you schedule a follow-up appointment As Needed  Your physician recommends that you continue on your current medications as directed. Please refer to the Current Medication list given to you today.  If you need a refill on your cardiac medications before your next appointment, please call your pharmacy.  Thank you for choosing [facility]!    

## 2016-09-10 NOTE — Progress Notes (Signed)
Cardiology Office Note   Date:  09/10/2016   ID:  Stevey, Stapleton 02/03/1961, MRN [id number]  PCP:  Bennie Pierini, FNP  Cardiologist: Arlington Calix, NP   Chief Complaint  Patient presents with  . Chest Pain      History of Present Illness: [name] is a 55 y.o. female who presents for ongoing assessment and management of atypical chest pain, with history of hyperlipidemia, diabetes. She was last in the office on 08/20/2016 with ongoing chest discomfort. She has since been diagnosed with Alpha-Gal syndrome. On last office visit she was scheduled for nuclear medicine stress test for diagnostic prognostic purposes as she had an abnormal GXT. She was also found to be hypertensive during that stress test.    No clearly diagnostic ST segment changes over baseline. Mild hypertensive response noted. Low risk Duke treadmill score of 5.  No significant myocardial profusion defects to indicate scar or ischemia.  This is a low risk study.  Nuclear stress EF: 76%.  She is here today with continued intermittent chest discomfort. She has not had a taking a nitroglycerin. She believes found this may be GERD. She states that she has had this occur before. Past Medical History:  Diagnosis Date  . Cataract   . Heart attack   . Idiopathic anaphylactic reaction     Past Surgical History:  Procedure Laterality Date  . ABDOMINAL HYSTERECTOMY    . CATARACT EXTRACTION W/PHACO Left 12/24/2013   Procedure: CATARACT EXTRACTION PHACO AND INTRAOCULAR LENS PLACEMENT (IOC);  Surgeon: Gemma Payor, MD;  Location: [facility];  Service: Ophthalmology;  Laterality: Left;  CDE:  10.84  . CATARACT EXTRACTION W/PHACO Right 01/07/2014   Procedure: CATARACT EXTRACTION PHACO AND INTRAOCULAR LENS PLACEMENT (IOC);  Surgeon: Gemma Payor, MD;  Location: [facility];  Service: Ophthalmology;  Laterality: Right;  CDE:7.76  . TUBAL LIGATION       Current Outpatient Prescriptions  Medication Sig  Dispense Refill  . aspirin EC 81 MG tablet Take 81 mg by mouth daily.    . cetirizine (ZYRTEC) 10 MG tablet Take 10 mg by mouth as needed for allergies.    Marland Kitchen EPINEPHrine 0.15 MG/0.15ML IJ injection Inject 0.15 mLs (0.15 mg total) into the muscle as needed for anaphylaxis. 2 Device 3  . FLUoxetine (PROZAC) 20 MG tablet Take 1 tablet (20 mg total) by mouth daily. 90 tablet 1  . nitroGLYCERIN (NITROSTAT) 0.4 MG SL tablet Place 1 tablet (0.4 mg total) under the tongue every 5 (five) minutes as needed for chest pain. 30 tablet 0  . Pediatric Multiple Vit-C-FA (PEDIATRIC MULTIVITAMIN) chewable tablet Chew 1 tablet by mouth daily.     No current facility-administered medications for this visit.     Allergies:   Cheese; Mango flavor; Meat extract; and Meperidine hcl    Social History:  The patient  reports that she has never smoked. She has never used smokeless tobacco. She reports that she drinks alcohol. She reports that she does not use drugs.   Family History:  The patient's family history includes CAD in her father; Luiz Blare' disease in her mother.    ROS: All other systems are reviewed and negative. Unless otherwise mentioned in H&P    PHYSICAL EXAM: VS:  BP 112/70   Pulse 81   Ht 5' 2.5" (1.588 m)   Wt 151 lb (68.5 kg)   SpO2 96%   BMI 27.18 kg/m  , BMI Body mass index is 27.18 kg/m. GEN: Well nourished, well  developed, in no acute distress  Cardiac: RRR; no murmurs, rubs, or gallops,no edema  Respiratory:  clear to auscultation bilaterally, normal work of breathing Psych: euthymic mood, full affect   Recent Labs: 02/02/2016: ALT 31 07/29/2016: BUN 18; Creatinine, Ser 0.86; Hemoglobin 13.4; Platelets 311; Potassium 3.9; Sodium 139; TSH 1.860    Lipid Panel    Component Value Date/Time   CHOL 244 (H) 07/29/2016 0744   CHOL 251 (H) 02/02/2016 1153   TRIG 115 07/29/2016 0744   HDL 69 07/29/2016 0744   HDL 82 02/02/2016 1153   CHOLHDL 3.5 07/29/2016 0744   VLDL 23 07/29/2016  0744   LDLCALC 152 (H) 07/29/2016 0744   LDLCALC 152 (H) 02/02/2016 1153      Wt Readings from Last 3 Encounters:  09/10/16 151 lb (68.5 kg)  08/20/16 154 lb (69.9 kg)  08/06/16 154 lb (69.9 kg)     ASSESSMENT AND PLAN:  1.  Chest pain: Nuclear medicine stress test was normal. She did have mildly hypertensive response. Will not make any further changes or additions in regimen. We will see her on a when necessary basis unless she becomes symptomatic.  2. Depression: She will follow with primary care concerning ongoing management.   Labs/ tests ordered today include:  No orders of the defined types were placed in this encounter.    Disposition:   FU with when necessary   Signed, Joni ReiningKathryn Lawrence, NP  09/10/2016 2:01 PM    [facility] [address], [place], [place] [address] Phone: [phone]; Fax: [phone]

## 2016-09-10 NOTE — Progress Notes (Signed)
Name: [name]    DOB: 1961-03-19  Age: 55 y.o.  MR#: [id number]       PCP:  Bennie Pierini, FNP      Insurance: Payor: BLUE CROSS BLUE SHIELD / Plan: [facility] PPO / Product Type: *No Product type* /   CC:   No chief complaint on file.   VS Vitals:   09/10/16 1331  Pulse: 81  SpO2: 96%  Weight: 151 lb (68.5 kg)  Height: 5' 2.5" (1.588 m)    Weights Current Weight  09/10/16 151 lb (68.5 kg)  08/20/16 154 lb (69.9 kg)  08/06/16 154 lb (69.9 kg)    Blood Pressure  BP Readings from Last 3 Encounters:  08/20/16 140/73  08/06/16 109/75  07/29/16 107/60     Admit date:  (Not on file) Last encounter with RMR:  08/20/2016   Allergy Cheese; Mango flavor; Meat extract; and Meperidine hcl  Current Outpatient Prescriptions  Medication Sig Dispense Refill  . aspirin EC 81 MG tablet Take 81 mg by mouth daily.    . cetirizine (ZYRTEC) 10 MG tablet Take 10 mg by mouth as needed for allergies.    Marland Kitchen EPINEPHrine 0.15 MG/0.15ML IJ injection Inject 0.15 mLs (0.15 mg total) into the muscle as needed for anaphylaxis. 2 Device 3  . FLUoxetine (PROZAC) 20 MG tablet Take 1 tablet (20 mg total) by mouth daily. 90 tablet 1  . nitroGLYCERIN (NITROSTAT) 0.4 MG SL tablet Place 1 tablet (0.4 mg total) under the tongue every 5 (five) minutes as needed for chest pain. 30 tablet 0  . Pediatric Multiple Vit-C-FA (PEDIATRIC MULTIVITAMIN) chewable tablet Chew 1 tablet by mouth daily.     No current facility-administered medications for this visit.     Discontinued Meds:   There are no discontinued medications.  Patient Active Problem List   Diagnosis Date Noted  . 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 Pill-induced gastritis 08/01/2016  . Unstable angina (HCC) 07/29/2016  . Hyperlipidemia 07/29/2016  . Annual physical exam 02/02/2016  . Anxiety state 01/15/2011  . Atypical chest pain 01/15/2011    LABS    Component Value Date/Time   NA 139 07/29/2016 0121   NA 141  02/02/2016 1153   NA 142 03/19/2014 1441   NA 143 12/20/2013 1510   NA 140 01/05/2011 0905   K 3.9 07/29/2016 0121   K 4.5 02/02/2016 1153   K 4.1 03/19/2014 1441   CL 102 07/29/2016 0121   CL 100 02/02/2016 1153   CL 103 03/19/2014 1441   CO2 28 07/29/2016 0121   CO2 24 02/02/2016 1153   CO2 25 03/19/2014 1441   GLUCOSE 105 (H) 07/29/2016 0121   GLUCOSE 67 02/02/2016 1153   GLUCOSE 97 03/19/2014 1441   GLUCOSE 107 (H) 12/20/2013 1510   GLUCOSE 91 01/05/2011 0905   BUN 18 07/29/2016 0121   BUN 18 02/02/2016 1153   BUN 17 03/19/2014 1441   BUN 21 12/20/2013 1510   BUN 7 01/05/2011 0905   CREATININE 0.86 07/29/2016 0121   CREATININE 0.68 02/02/2016 1153   CREATININE 0.86 03/19/2014 1441   CALCIUM 9.5 07/29/2016 0121   CALCIUM 9.6 02/02/2016 1153   CALCIUM 9.2 03/19/2014 1441   GFRNONAA >60 07/29/2016 0121   GFRNONAA 99 02/02/2016 1153   GFRNONAA 77 03/19/2014 1441   GFRAA >60 07/29/2016 0121   GFRAA 114 02/02/2016 1153   GFRAA 89 03/19/2014 1441   CMP     Component Value Date/Time  NA 139 07/29/2016 0121   NA 141 02/02/2016 1153   K 3.9 07/29/2016 0121   CL 102 07/29/2016 0121   CO2 28 07/29/2016 0121   GLUCOSE 105 (H) 07/29/2016 0121   BUN 18 07/29/2016 0121   BUN 18 02/02/2016 1153   CREATININE 0.86 07/29/2016 0121   CALCIUM 9.5 07/29/2016 0121   PROT 7.3 02/02/2016 1153   ALBUMIN 4.7 02/02/2016 1153   AST 29 02/02/2016 1153   ALT 31 02/02/2016 1153   ALKPHOS 81 02/02/2016 1153   BILITOT 0.5 02/02/2016 1153   GFRNONAA >60 07/29/2016 0121   GFRAA >60 07/29/2016 0121       Component Value Date/Time   WBC 6.6 07/29/2016 0121   WBC 7.0 02/02/2016 1153   WBC 7.2 01/05/2011 0905   WBC 7.8 01/04/2011 1725   HGB 13.4 07/29/2016 0121   HGB 14.3 12/20/2013 1510   HGB 13.8 01/05/2011 0905   HCT 39.9 07/29/2016 0121   HCT 41.1 02/02/2016 1153   HCT 42.3 12/20/2013 1510   HCT 41.0 01/05/2011 0905   MCV 91.3 07/29/2016 0121   MCV 89 02/02/2016 1153   MCV  91.9 01/05/2011 0905   MCV 90.6 01/04/2011 1725    Lipid Panel     Component Value Date/Time   CHOL 244 (H) 07/29/2016 0744   CHOL 251 (H) 02/02/2016 1153   TRIG 115 07/29/2016 0744   HDL 69 07/29/2016 0744   HDL 82 02/02/2016 1153   CHOLHDL 3.5 07/29/2016 0744   VLDL 23 07/29/2016 0744   LDLCALC 152 (H) 07/29/2016 0744   LDLCALC 152 (H) 02/02/2016 1153    ABG No results found for: PHART, PCO2ART, PO2ART, HCO3, TCO2, ACIDBASEDEF, O2SAT   Lab Results  Component Value Date   TSH 1.860 07/29/2016   BNP (last 3 results) No results for input(s): BNP in the last 8760 hours.  ProBNP (last 3 results) No results for input(s): PROBNP in the last 8760 hours.  Cardiac Panel (last 3 results) No results for input(s): CKTOTAL, CKMB, TROPONINI, RELINDX in the last 72 hours.  Iron/TIBC/Ferritin/ %Sat No results found for: IRON, TIBC, FERRITIN, IRONPCTSAT   EKG Orders placed or performed during the hospital encounter of 07/29/16  . EKG 12-Lead  . EKG 12-Lead  . EKG  . EKG 12-Lead  . EKG 12-Lead     Prior Assessment and Plan Problem List as of 09/10/2016 Reviewed: 08/20/2016  2:34 PM by Joni ReiningKathryn Lawrence, NP     Cardiovascular and Mediastinum   Unstable angina [facility](HCC)     Digestive   Pill-induced gastritis     Other   Anxiety state   Atypical chest pain   Annual physical exam   Hyperlipidemia   Alpha galactosidase deficiency       Imaging: Nm Myocar Multi W/spect W/wall Motion / Ef  Result Date: 09/08/2016  No clearly diagnostic ST segment changes over baseline. Mild hypertensive response noted. Low risk Duke treadmill score of 5.  No significant myocardial profusion defects to indicate scar or ischemia.  This is a low risk study.  Nuclear stress EF: 76%.

## 2016-10-12 ENCOUNTER — Telehealth: Payer: Self-pay | Admitting: Nurse Practitioner

## 2016-10-12 NOTE — Telephone Encounter (Signed)
FAXED / lat

## 2016-10-23 ENCOUNTER — Other Ambulatory Visit: Payer: Self-pay | Admitting: Nurse Practitioner

## 2016-12-15 ENCOUNTER — Encounter: Payer: Self-pay | Admitting: Nurse Practitioner

## 2016-12-15 ENCOUNTER — Ambulatory Visit (INDEPENDENT_AMBULATORY_CARE_PROVIDER_SITE_OTHER): Payer: BC Managed Care – PPO | Admitting: Nurse Practitioner

## 2016-12-15 VITALS — BP 117/74 | HR 78 | Temp 97.4°F | Ht 62.0 in | Wt 155.0 lb

## 2016-12-15 DIAGNOSIS — R109 Unspecified abdominal pain: Secondary | ICD-10-CM | POA: Diagnosis not present

## 2016-12-15 DIAGNOSIS — M2550 Pain in unspecified joint: Secondary | ICD-10-CM | POA: Diagnosis not present

## 2016-12-15 MED ORDER — NAPROXEN 500 MG PO TABS: 500.0000 mg | ORAL_TABLET | Freq: Two times a day (BID) | ORAL | 1 refills | Status: DC

## 2016-12-15 NOTE — Progress Notes (Signed)
   Subjective:    Patient ID: [name], female    DOB: 15-Mar-1961, 56 y.o.   MRN: [id number]  HPI Patient comes in today C/O: - right sided pelvic pain. Started a week ago- rated 7-8/10 at first now about 3-10. Moving around increases pain. Sitting and resting helps pain some. Denies any constipation or diarrhea. No nausea or vomiting. - allover joint pain- started a couple of months ago after being dx with meat allergy- she has had to stop red meat and has increased her dairy intake nad wonders if that has anything to do with her joint pain different joints hurt at different times- worse and more often on right then left.   Review of Systems  Constitutional: Negative for activity change.  HENT: Negative.   Eyes: Negative.   Respiratory: Negative.   Cardiovascular: Negative.   Gastrointestinal: Negative for constipation, diarrhea, nausea and vomiting.  Genitourinary: Positive for pelvic pain. Negative for dysuria and frequency.  Musculoskeletal: Positive for arthralgias.  Neurological: Negative.   Psychiatric/Behavioral: Negative.   All other systems reviewed and are negative.      Objective:   Physical Exam  Constitutional: She is oriented to person, place, and time. She appears well-developed and well-nourished. No distress.  Cardiovascular: Normal rate and regular rhythm.   Pulmonary/Chest: Effort normal and breath sounds normal.  Abdominal: Soft. She exhibits no mass. There is tenderness (right pelvic pain  on palpation.). There is no rebound and no guarding.  Musculoskeletal:  FROM of all joints No edema in any joints Just achy n right shoulder and elbow today Grips equal bil  Neurological: She is alert and oriented to person, place, and time.  Skin: Skin is warm.  Psychiatric: She has a normal mood and affect. Her behavior is normal. Judgment and thought content normal.   BP 117/74   Pulse 78   Temp 97.4 F (36.3 C) (Oral)   Ht 5\' 2"  (1.575 m)   Wt 155 lb  (70.3 kg)   BMI 28.35 kg/m         Assessment & Plan:  1. Right-sided abdominal pain of unknown cause Will wait on US results - CBC with Differential/Platelet - US Abdomen Complete; Future - US Pelvis Complete; Future  2. Arthralgia, unspecified joint Moist heat to joint Stretching exercises Will talk once labs are back - Arthritis Panel - naproxen (NAPROSYN) 500 MG tablet; Take 1 tablet (500 mg total) by mouth 2 (two) times daily with a meal.  Dispense: 60 tablet; Refill: 1  [name] Daphine DeutscherMartin, FNP

## 2016-12-15 NOTE — Patient Instructions (Signed)
Joint Pain Introduction Joint pain can be caused by many things. The joint can be bruised, infected, weak from aging, or sore from exercise. The pain will probably go away if you follow your doctor's instructions for home care. If your joint pain continues, more tests may be needed to help find the cause of your condition. Follow these instructions at home: Watch your condition for any changes. Follow these instructions as told to lessen the pain that you are feeling:  Take medicines only as told by your doctor.  Rest the sore joint for as long as told by your doctor. If your doctor tells you to, raise (elevate) the painful joint above the level of your heart while you are sitting or lying down.  Do not do things that cause pain or make the pain worse.  If told, put ice on the painful area:  Put ice in a plastic bag.  Place a towel between your skin and the bag.  Leave the ice on for 20 minutes, 2-3 times per day.  Wear an elastic bandage, splint, or sling as told by your doctor. Loosen the bandage or splint if your fingers or toes lose feeling (become numb) and tingle, or if they turn cold and blue.  Begin exercising or stretching the joint as told by your doctor. Ask your doctor what types of exercise are safe for you.  Keep all follow-up visits as told by your doctor. This is important. Contact a doctor if:  Your pain gets worse and medicine does not help it.  Your joint pain does not get better in 3 days.  You have more bruising or swelling.  You have a fever.  You lose 10 pounds (4.5 kg) or more without trying. Get help right away if:  You are not able to move the joint.  Your fingers or toes become numb or they turn cold and blue. This information is not intended to replace advice given to you by your health care provider. Make sure you discuss any questions you have with your health care provider. Document Released: 10/20/2009 Document Revised: 04/08/2016 Document  Reviewed: 08/13/2014  2017 Elsevier  

## 2016-12-16 ENCOUNTER — Other Ambulatory Visit: Payer: Self-pay | Admitting: Nurse Practitioner

## 2016-12-16 DIAGNOSIS — R52 Pain, unspecified: Secondary | ICD-10-CM

## 2016-12-16 LAB — ARTHRITIS PANEL
BASOS ABS: 0 10*3/uL (ref 0.0–0.2)
Basos: 1 %
EOS (ABSOLUTE): 0.1 10*3/uL (ref 0.0–0.4)
EOS: 2 %
HEMATOCRIT: 38.9 % (ref 34.0–46.6)
Hemoglobin: 12.8 g/dL (ref 11.1–15.9)
IMMATURE GRANS (ABS): 0 10*3/uL (ref 0.0–0.1)
Immature Granulocytes: 0 %
LYMPHS ABS: 2.2 10*3/uL (ref 0.7–3.1)
LYMPHS: 36 %
MCH: 29 pg (ref 26.6–33.0)
MCHC: 32.9 g/dL (ref 31.5–35.7)
MCV: 88 fL (ref 79–97)
MONOCYTES: 9 %
Monocytes Absolute: 0.5 10*3/uL (ref 0.1–0.9)
Neutrophils Absolute: 3.2 10*3/uL (ref 1.4–7.0)
Neutrophils: 52 %
PLATELETS: 286 10*3/uL (ref 150–379)
RBC: 4.41 x10E6/uL (ref 3.77–5.28)
RDW: 13.8 % (ref 12.3–15.4)
Rhuematoid fact SerPl-aCnc: <10 IU/mL (ref 0.0–13.9)
SED RATE: 2 mm/h (ref 0–40)
Uric Acid: 4.5 mg/dL (ref 2.5–7.1)
WBC: 6.1 10*3/uL (ref 3.4–10.8)

## 2016-12-27 ENCOUNTER — Ambulatory Visit (HOSPITAL_COMMUNITY): Admission: RE | Admit: 2016-12-27 | Payer: BC Managed Care – PPO | Source: Ambulatory Visit

## 2016-12-27 ENCOUNTER — Ambulatory Visit (HOSPITAL_COMMUNITY): Payer: BC Managed Care – PPO

## 2017-01-18 ENCOUNTER — Ambulatory Visit (HOSPITAL_COMMUNITY)
Admission: RE | Admit: 2017-01-18 | Discharge: 2017-01-18 | Disposition: A | Discharge status: Home / Self Care | Payer: BC Managed Care – PPO | Source: Ambulatory Visit | Attending: Nurse Practitioner | Admitting: Nurse Practitioner

## 2017-01-18 ENCOUNTER — Encounter (HOSPITAL_COMMUNITY)
Admission: RE | Admit: 2017-01-18 | Discharge: 2017-01-18 | Disposition: A | Discharge status: Home / Self Care | Payer: BC Managed Care – PPO | Source: Ambulatory Visit | Attending: Nurse Practitioner | Admitting: Nurse Practitioner

## 2017-01-18 DIAGNOSIS — Z90710 Acquired absence of both cervix and uterus: Secondary | ICD-10-CM | POA: Diagnosis not present

## 2017-01-18 DIAGNOSIS — R109 Unspecified abdominal pain: Secondary | ICD-10-CM | POA: Diagnosis present

## 2017-01-18 DIAGNOSIS — Z90722 Acquired absence of ovaries, bilateral: Secondary | ICD-10-CM | POA: Insufficient documentation

## 2017-01-18 DIAGNOSIS — R52 Pain, unspecified: Secondary | ICD-10-CM

## 2017-05-27 ENCOUNTER — Other Ambulatory Visit: Payer: Self-pay | Admitting: Nurse Practitioner

## 2017-05-27 DIAGNOSIS — Z1231 Encounter for screening mammogram for malignant neoplasm of breast: Secondary | ICD-10-CM

## 2017-06-01 ENCOUNTER — Ambulatory Visit (HOSPITAL_COMMUNITY)
Admission: RE | Admit: 2017-06-01 | Discharge: 2017-06-01 | Disposition: A | Discharge status: Home / Self Care | Payer: BC Managed Care – PPO | Source: Ambulatory Visit | Attending: Nurse Practitioner | Admitting: Nurse Practitioner

## 2017-06-01 DIAGNOSIS — Z1231 Encounter for screening mammogram for malignant neoplasm of breast: Secondary | ICD-10-CM | POA: Diagnosis present

## 2017-07-25 ENCOUNTER — Telehealth: Payer: Self-pay | Admitting: Nurse Practitioner

## 2017-07-25 ENCOUNTER — Encounter: Payer: Self-pay | Admitting: Family Medicine

## 2017-07-25 ENCOUNTER — Ambulatory Visit (INDEPENDENT_AMBULATORY_CARE_PROVIDER_SITE_OTHER): Payer: BC Managed Care – PPO | Admitting: Family Medicine

## 2017-07-25 VITALS — BP 120/74 | HR 68 | Temp 97.5°F | Ht 62.0 in | Wt 144.0 lb

## 2017-07-25 DIAGNOSIS — L089 Local infection of the skin and subcutaneous tissue, unspecified: Secondary | ICD-10-CM

## 2017-07-25 MED ORDER — CEPHALEXIN 500 MG PO CAPS: 500.0000 mg | ORAL_CAPSULE | Freq: Four times a day (QID) | ORAL | 0 refills | Status: DC

## 2017-07-25 NOTE — Telephone Encounter (Signed)
Pt aware.

## 2017-07-25 NOTE — Telephone Encounter (Signed)
I would avoid tubs/ pools/ hot tubs for now until she has completed abx so as to not expose the broken skin to other bacteria/ infection.  Showering is ok. Please inform patient.

## 2017-07-25 NOTE — Progress Notes (Signed)
   Subjective: CC:?spider bite PCP: Bennie PieriniMartin, [name], FNP EAV:WUJWJHPI:Jeannine Sydnee LevansM Garza is a 56 y.o. female presenting to clinic today for:  1. Concern for spider bite Patient reports that she developed mild redness over the left cubital fossa about 4 days ago. She notes that over the last couple of days it's become more red, has increased warmth, and is tender. She reports a burning-like sensation over the area of redness. She is applied baby oil to help with stress area. She's not used any other medications for this. She does not remember being bit by anything. Though she does report she works outside often and there were several insects outside. No known exposures including new foods, detergents, soaps, lotions. She has a history of alpha gal and she is very careful about food intake and topical creams/ointments.  She denies preceding URI symptoms, including cough, congestion, shortness of breath, fevers, chills. She denies GI symptoms. No purulence from rash. No recent travel, sick contacts.  Additionally, she reports she was started on doxycycline about 2 weeks ago [facility], who follow her for her alpha gal.  Allergies  Allergen Reactions  . Cheese     Alpha-Gal Syndrome  . Mango Flavor Anaphylaxis  . Meat Extract     Alpha-Gal Syndrome  . Meperidine Hcl Other (See Comments)    SYNCOPE   Past Medical History:  Diagnosis Date  . Cataract   . Heart attack   . Idiopathic anaphylactic reaction    Family History  Problem Relation Age of Onset  . Graves' disease Mother   . CAD Father    Social Hx: non smoker.Current medications reviewed.   ROS: Per HPI  Objective: Office vital signs reviewed. BP 120/74   Pulse 68   Temp (!) 97.5 F (36.4 C) (Oral)   Ht 5\' 2"  (1.575 Ariel)   Wt 144 lb (65.3 kg)   BMI 26.34 kg/Ariel   Physical Examination:  General: Awake, alert, well nourished, well appearing female, No acute distress Extremities: warm, well  perfused, No edema, cyanosis or clubbing; +2 pulses bilaterally. She has full active range of motion of bilateral extremities. Skin as below Skin: dry; intact; a large area of erythema present over the left cubital fossa. Area is warm, tender to palpation. There is mild soft tissue swelling near the ulnar aspect. Mild skin breakdown towards the center of the cubital fossa. No vesicles, purulence, exudate, bleeding noted. See photograph below.    Assessment/ Plan: 56 y.o. female   1. Skin infection She had full painless active range of motion, suggesting against joint involvement.  No evidence of insect bite. Doubt contact dermatitis given lack of pruritus. Erythrasma also considered.  Her exam is more consistent with erysipelas infection.  Doxycycline does cover some strep but is not an ideal antibiotic for erysipelas infection. I also had Dr. Ermalinda MemosBradshaw evaluate arm and he agrees with assessment and plan.  I have added Keflex 500 mg to take 4 times a day for the next 10 days. I recommended that she keep area clean. Strict return precautions were reviewed with the patient including: Worsening tenderness, worsening redness, purulence, fevers, chills, or swelling. Patient to follow-up as needed.   Raliegh IpAshly Ariel Gottschalk, DO [facility] [phone]

## 2017-07-25 NOTE — Patient Instructions (Signed)
Your rash appears consistent with something called your recent the list which is the soft tissue/skin infection caused by strep. I have prescribed an additional antibiotic for you to take to fully cover you for Streptococcus. You should experience no worsening of her symptoms after 48 hours on this new antibiotic. If you develop fevers, chills, spreading of the redness, worsening pain, or pus coming from your rash, please seek immediate medical attention.  Erysipelas Erysipelas is an infection that affects the skin and the tissues that are near the surface of the skin. It causes the skin to become red, swollen, and painful. The infection is most common on the legs but may also affect other areas, such as the face. With treatment, the infection usually goes away in a few days. If not treated, the infection can spread or lead to other problems, such as abscesses. What are the causes? Erysipelas is caused by bacteria. Most often, it is caused by bacteria called streptococci. The bacteria often enter through a break in the skin, such as a cut, surgical incision, burn, insect bite, open sore, or crack in the skin. Sometimes the source where the bacteria entered is not known. What increases the risk? Some people are at an increased risk for developing erysipelas, including:  Young children.  Elderly people.  People with a weakened body defense system (immune system), such as people with HIV or AIDS.  People who have diabetes.  People who drink too much alcohol.  People who have had recent surgery.  People with yeast infections of the skin.  People who have swollen legs.  What are the signs or symptoms? The infection causes a reddened area on the skin. This reddened area may:  Be painful and swollen.  Have a distinct border around it.  Feel itchy and hot.  Develop blisters.  Other symptoms may include:  Fever.  Chills.  Nausea and vomiting.  Swollen glands (lymph  nodes).  Headache.  Fatigue.  Loss of appetite.  How is this diagnosed? Your health care provider will take your medical history and do a physical exam. He or she will usually be able to diagnose erysipelas by closely examining your skin. How is this treated? Erysipelas can usually be treated effectively with antibiotic medicines. The infection usually gets better within a few days of treatment. Follow these instructions at home:  Take medicines only as directed by your health care provider.  Take your antibiotic medicine as directed by your health care provider. Finish the antibiotic even if you start to feel better.  If the skin infection is on your leg or arm, elevate the leg or arm to help reduce swelling.  Do not put creams or lotions on the affected area of your skin unless your health care provider instructs you to do that.  Do not share bedding, towels, or washcloths (linens) with other people. Using only your own linens will help to prevent the infection from spreading to others.  Keep all follow-up visits as directed by your health care provider. This is important. Contact a health care provider if:  You have pain or discomfort that is not controlled by medicines.  Your red area of skin gets larger or turns dark in color.  Your skin infection returns in the same area or appears in another area. Get help right away if:  Your fever is getting worse.  Your feelings of illness are getting worse.  You notice red streaks coming from the infected area. This information is not  intended to replace advice given to you by your health care provider. Make sure you discuss any questions you have with your health care provider. Document Released: 07/27/2001 Document Revised: 04/08/2016 Document Reviewed: 06/17/2014 Elsevier Interactive Patient Education  2018 [employer]

## 2017-07-27 ENCOUNTER — Telehealth: Payer: Self-pay | Admitting: Family Medicine

## 2017-07-27 NOTE — Telephone Encounter (Signed)
Called to check in on how patient is responding to Keflex.  She notes that rash is improving.  She does report some dried skin over area of where rash was. I advised her to avoid Neosporin, given risk of localized skin reaction.  Ok to apply vaseline prn dryness.  Follow up as needed.    Ashly M. Nadine CountsGottschalk, DO [facility]

## 2017-09-22 ENCOUNTER — Ambulatory Visit: Payer: BC Managed Care – PPO | Admitting: Nurse Practitioner

## 2017-09-22 ENCOUNTER — Ambulatory Visit (INDEPENDENT_AMBULATORY_CARE_PROVIDER_SITE_OTHER): Payer: BC Managed Care – PPO

## 2017-09-22 ENCOUNTER — Encounter: Payer: Self-pay | Admitting: Nurse Practitioner

## 2017-09-22 VITALS — BP 113/68 | HR 76 | Temp 97.2°F | Ht 62.0 in | Wt 140.0 lb

## 2017-09-22 DIAGNOSIS — F320 Major depressive disorder, single episode, mild: Secondary | ICD-10-CM

## 2017-09-22 DIAGNOSIS — M898X1 Other specified disorders of bone, shoulder: Secondary | ICD-10-CM | POA: Diagnosis not present

## 2017-09-22 MED ORDER — MELOXICAM 15 MG PO TABS: 15.0000 mg | ORAL_TABLET | Freq: Every day | ORAL | 3 refills | Status: DC

## 2017-09-22 MED ORDER — FLUOXETINE HCL 40 MG PO CAPS: 40.0000 mg | ORAL_CAPSULE | Freq: Every day | ORAL | 3 refills | Status: DC

## 2017-09-22 NOTE — Patient Instructions (Signed)
Stress and Stress Management Stress is a normal reaction to life events. It is what you feel when life demands more than you are used to or more than you can handle. Some stress can be useful. For example, the stress reaction can help you catch the last bus of the day, study for a test, or meet a deadline at work. But stress that occurs too often or for too long can cause problems. It can affect your emotional health and interfere with relationships and normal daily activities. Too much stress can weaken your immune system and increase your risk for physical illness. If you already have a medical problem, stress can make it worse. What are the causes? All sorts of life events may cause stress. An event that causes stress for one person may not be stressful for another person. Major life events commonly cause stress. These may be positive or negative. Examples include losing your job, moving into a new home, getting married, having a baby, or losing a loved one. Less obvious life events may also cause stress, especially if they occur day after day or in combination. Examples include working long hours, driving in traffic, caring for children, being in debt, or being in a difficult relationship. What are the signs or symptoms? Stress may cause emotional symptoms including, the following:  Anxiety. This is feeling worried, afraid, on edge, overwhelmed, or out of control.  Anger. This is feeling irritated or impatient.  Depression. This is feeling sad, down, helpless, or guilty.  Difficulty focusing, remembering, or making decisions.  Stress may cause physical symptoms, including the following:  Aches and pains. These may affect your head, neck, back, stomach, or other areas of your body.  Tight muscles or clenched jaw.  Low energy or trouble sleeping.  Stress may cause unhealthy behaviors, including the following:  Eating to feel better (overeating) or skipping meals.  Sleeping too little,  too much, or both.  Working too much or putting off tasks (procrastination).  Smoking, drinking alcohol, or using drugs to feel better.  How is this diagnosed? Stress is diagnosed through an assessment by your health care provider. Your health care provider will ask questions about your symptoms and any stressful life events.Your health care provider will also ask about your medical history and may order blood tests or other tests. Certain medical conditions and medicine can cause physical symptoms similar to stress. Mental illness can cause emotional symptoms and unhealthy behaviors similar to stress. Your health care provider may refer you to a mental health professional for further evaluation. How is this treated? Stress management is the recommended treatment for stress.The goals of stress management are reducing stressful life events and coping with stress in healthy ways. Techniques for reducing stressful life events include the following:  Stress identification. Self-monitor for stress and identify what causes stress for you. These skills may help you to avoid some stressful events.  Time management. Set your priorities, keep a calendar of events, and learn to say "no." These tools can help you avoid making too many commitments.  Techniques for coping with stress include the following:  Rethinking the problem. Try to think realistically about stressful events rather than ignoring them or overreacting. Try to find the positives in a stressful situation rather than focusing on the negatives.  Exercise. Physical exercise can release both physical and emotional tension. The key is to find a form of exercise you enjoy and do it regularly.  Relaxation techniques. These relax the body and  mind. Examples include yoga, meditation, tai chi, biofeedback, deep breathing, progressive muscle relaxation, listening to music, being out in nature, journaling, and other hobbies. Again, the key is to find  one or more that you enjoy and can do regularly.  Healthy lifestyle. Eat a balanced diet, get plenty of sleep, and do not smoke. Avoid using alcohol or drugs to relax.  Strong support network. Spend time with family, friends, or other people you enjoy being around.Express your feelings and talk things over with someone you trust.  Counseling or talktherapy with a mental health professional may be helpful if you are having difficulty managing stress on your own. Medicine is typically not recommended for the treatment of stress.Talk to your health care provider if you think you need medicine for symptoms of stress. Follow these instructions at home:  Keep all follow-up visits as directed by your health care provider.  Take all medicines as directed by your health care provider. Contact a health care provider if:  Your symptoms get worse or you start having new symptoms.  You feel overwhelmed by your problems and can no longer manage them on your own. Get help right away if:  You feel like hurting yourself or someone else. This information is not intended to replace advice given to you by your health care provider. Make sure you discuss any questions you have with your health care provider. Document Released: 04/27/2001 Document Revised: 04/08/2016 Document Reviewed: 06/26/2013 Elsevier Interactive Patient Education  2017 Elsevier Inc.  

## 2017-09-22 NOTE — Progress Notes (Addendum)
   Subjective:    Patient ID: [name], female    DOB: March 06, 1961, 56 y.o.   MRN: [id number]  HPI Patient comes in today with 2 complaints: - Patient c/o right hip pain and right scapula pain. She does water aerobic 3x a week and she wants to make sure nothing is injured. Says she cannot raise her right shoulder all the way like she can her left shoulder. - she is having some depression. Wants to start on prozac again. Depression screen [facility] 2/9 09/22/2017 07/25/2017 12/15/2016  Decreased Interest 1 - 1  Down, Depressed, Hopeless 1 0 1  PHQ - 2 Score 2 0 2  Altered sleeping 0 - 0  Tired, decreased energy 1 - 2  Change in appetite 2 - 3  Feeling bad or failure about yourself  2 - 0  Trouble concentrating 1 - 2  Moving slowly or fidgety/restless 2 - 2  Suicidal thoughts 1 - 0  PHQ-9 Score 11 - 11       Review of Systems  Constitutional: Negative for activity change and appetite change.  HENT: Negative.   Eyes: Negative for pain.  Respiratory: Negative for shortness of breath.   Cardiovascular: Negative for chest pain, palpitations and leg swelling.  Gastrointestinal: Negative for abdominal pain.  Endocrine: Negative for polydipsia.  Genitourinary: Negative.   Skin: Negative for rash.  Neurological: Negative for dizziness, weakness and headaches.  Hematological: Does not bruise/bleed easily.  Psychiatric/Behavioral: Negative.   All other systems reviewed and are negative.      Objective:   Physical Exam  Constitutional: She is oriented to person, place, and time. She appears well-developed and well-nourished. No distress.  Cardiovascular: Normal rate and regular rhythm.  Pulmonary/Chest: Effort normal and breath sounds normal.  Musculoskeletal:  Limited ROM of right shoulder on extension upward due to pain around scapula. No pain in actual shoulder joint.  Neurological: She is alert and oriented to person, place, and time.  Skin: Skin is warm.  Psychiatric: Her  behavior is normal. Judgment and thought content normal. She exhibits a depressed mood.   BP 113/68   Pulse 76   Temp (!) 97.2 F (36.2 C) (Oral)   Ht 5\' 2"  (1.575 m)   Wt 140 lb (63.5 kg)   BMI 25.61 kg/m   Right scapula xray- normal-Preliminary reading by Paulene FloorMary Chasin Findling, FNP  [facility]     Assessment & Plan:  1. Pain of right scapula Moist heat Continue stretching exercises - DG Scapula Right; Future - meloxicam (MOBIC) 15 MG tablet; Take 1 tablet (15 mg total) daily by mouth.  Dispense: 30 tablet; Refill: 3  2. Depression, major, single episode, mild (HCC) Stress management - FLUoxetine (PROZAC) 40 MG capsule; Take 1 capsule (40 mg total) daily by mouth.  Dispense: 90 capsule; Refill: 3  [name] Daphine DeutscherMartin, FNP

## 2017-09-22 NOTE — Addendum Note (Signed)
Addended by: Bennie PieriniMARTIN, [name] on: 09/22/2017 08:52 AM   Modules accepted: Orders

## 2017-12-04 IMAGING — DX DG CHEST 2V
2 series · 2 of 2 positions shown · non-contrast
Comparison: 01/04/2011

CLINICAL DATA: Midsternal chest pain for 1 hour.  Nausea.

EXAM:
CHEST  2 VIEW

[chest pa]
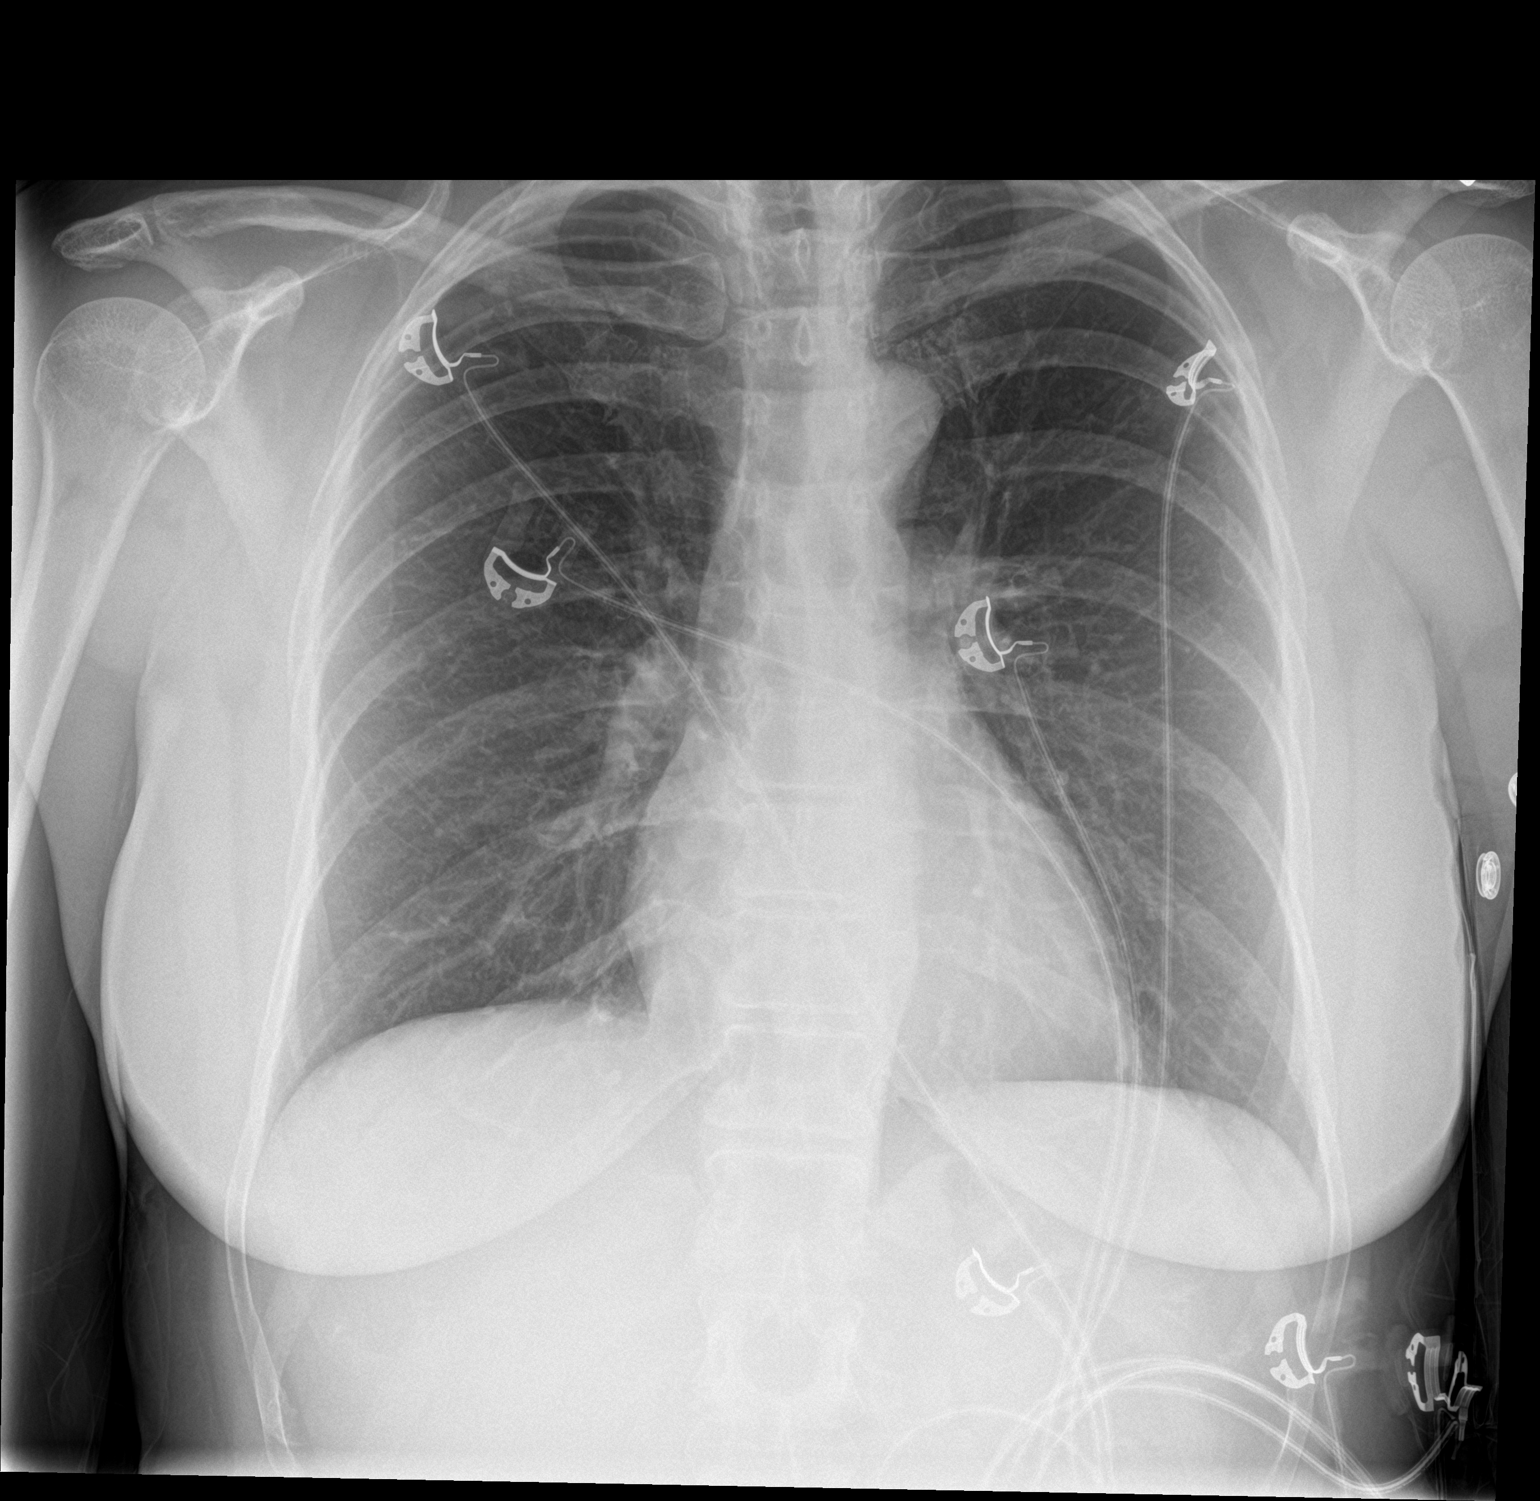

[chest lat]
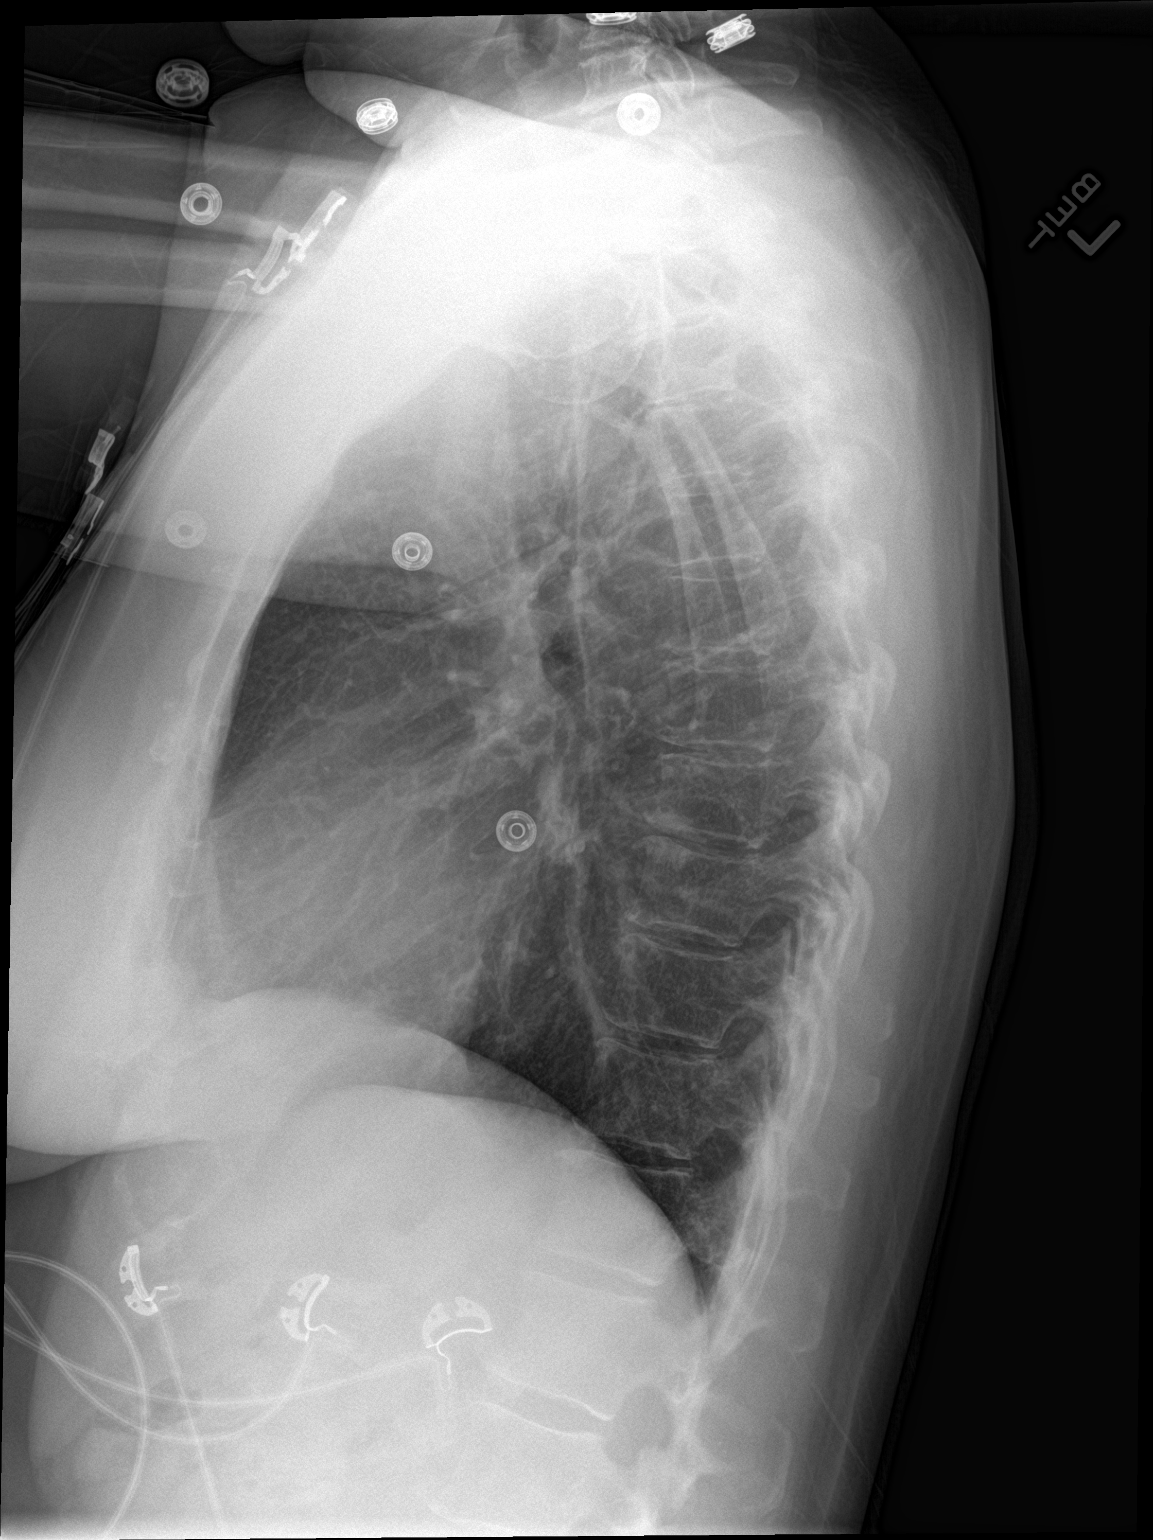

[2 of 2 positions shown; findings below may reference images not displayed]

FINDINGS: The cardiomediastinal contours are normal. The lungs are clear.
Pulmonary vasculature is normal. No consolidation, pleural effusion,
or pneumothorax. No acute osseous abnormalities are seen.
IMPRESSION: No active cardiopulmonary disease.

## 2018-05-10 ENCOUNTER — Encounter: Payer: Self-pay | Admitting: Family Medicine

## 2018-05-10 ENCOUNTER — Ambulatory Visit: Payer: BC Managed Care – PPO | Admitting: Family Medicine

## 2018-05-10 VITALS — BP 102/69 | HR 66 | Temp 98.8°F | Ht 62.0 in | Wt 123.6 lb

## 2018-05-10 DIAGNOSIS — R1030 Lower abdominal pain, unspecified: Secondary | ICD-10-CM

## 2018-05-10 DIAGNOSIS — R5383 Other fatigue: Secondary | ICD-10-CM | POA: Diagnosis not present

## 2018-05-10 DIAGNOSIS — J029 Acute pharyngitis, unspecified: Secondary | ICD-10-CM | POA: Diagnosis not present

## 2018-05-10 DIAGNOSIS — Z13220 Encounter for screening for lipoid disorders: Secondary | ICD-10-CM | POA: Diagnosis not present

## 2018-05-10 DIAGNOSIS — W57XXXA Bitten or stung by nonvenomous insect and other nonvenomous arthropods, initial encounter: Secondary | ICD-10-CM

## 2018-05-10 LAB — RAPID STREP SCREEN (MED CTR MEBANE ONLY): Strep Gp A Ag, IA W/Reflex: NEGATIVE

## 2018-05-10 LAB — CULTURE, GROUP A STREP

## 2018-05-10 MED ORDER — DOXYCYCLINE HYCLATE 100 MG PO TABS: 100.0000 mg | ORAL_TABLET | Freq: Two times a day (BID) | ORAL | 0 refills | Status: AC

## 2018-05-10 NOTE — Progress Notes (Signed)
Subjective: CC: tick bite PCP: Chevis Pretty, FNP BTD:HRCBU OLANDA BOUGHNER is a 57 y.o. female presenting to clinic today for:  1. Tick bite Patient reports several tick bites over the last several months.  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  She has a history of alpha gal.  She notes that over the last 4 to 5 days she has had onset of sore throat.  Denies any cough, congestion, runny nose, sneezing.  She has had mild lower abdominal pain for the last 2 weeks.  No associated nausea, vomiting, constipation, diarrhea.  Denies any fevers, chills, rashes.  She does note increasing fatigue and arthralgia over the last couple of weeks.  Her husband was recently diagnosed with [facility] spotted fever.  She is here because she would like to have take labs performed.   ROS: Per HPI  Allergies  Allergen Reactions  . Cheese     Alpha-Gal Syndrome  . Mango Flavor Anaphylaxis  . Meat Extract     Alpha-Gal Syndrome  . Meperidine Hcl Other (See Comments)    SYNCOPE   Past Medical History:  Diagnosis Date  . Cataract   . Heart attack ([place])   . Idiopathic anaphylactic reaction     Current Outpatient Medications:  .  EPINEPHrine 0.15 MG/0.15ML IJ injection, Inject 0.15 mLs (0.15 mg total) into the muscle as needed for anaphylaxis., Disp: 2 Device, Rfl: 3 .  estradiol (VIVELLE-DOT) 0.075 MG/24HR, , Disp: , Rfl:  .  FLUoxetine (PROZAC) 40 MG capsule, Take 1 capsule (40 mg total) daily by mouth., Disp: 90 capsule, Rfl: 3 .  nitroGLYCERIN (NITROSTAT) 0.4 MG SL tablet, Place 1 tablet (0.4 mg total) under the tongue every 5 (five) minutes as needed for chest pain., Disp: 30 tablet, Rfl: 0 .  progesterone (PROMETRIUM) 100 MG capsule, Take 100 mg by mouth daily., Disp: , Rfl:  .  TESTOSTERONE COMPOUNDING KIT 20 % CREA, Place onto the skin. nightly, Disp: , Rfl:  .  thyroid (ARMOUR) 65 MG tablet, Take 65 mg daily by mouth., Disp: , Rfl:  Social History   Socioeconomic History  .  Marital status: Married    Spouse name: Not on file  . Number of children: Not on file  . Years of education: Not on file  . Highest education level: Not on file  Occupational History  . Not on file  Social Needs  . Financial resource strain: Not on file  . Food insecurity:    Worry: Not on file    Inability: Not on file  . Transportation needs:    Medical: Not on file    Non-medical: Not on file  Tobacco Use  . Smoking status: Never Smoker  . Smokeless tobacco: Never Used  Substance and Sexual Activity  . Alcohol use: Yes    Comment: wine occasionally  . Drug use: No  . Sexual activity: Not on file  Lifestyle  . Physical activity:    Days per week: Not on file    Minutes per session: Not on file  . Stress: Not on file  Relationships  . Social connections:    Talks on phone: Not on file    Gets together: Not on file    Attends religious service: Not on file    Active member of club or organization: Not on file    Attends meetings of clubs or organizations: Not on file    Relationship status: Not on file  . Intimate partner violence:  Fear of current or ex partner: Not on file    Emotionally abused: Not on file    Physically abused: Not on file    Forced sexual activity: Not on file  Other Topics Concern  . Not on file  Social History Narrative  . Not on file   Family History  Problem Relation Age of Onset  . Graves' disease Mother   . CAD Father     Objective: Office vital signs reviewed. BP 102/69   Pulse 66   Temp 98.8 F (37.1 C) (Oral)   Ht '5\' 2"'  (1.575 m)   Wt 123 lb 9.6 oz (56.1 kg)   BMI 22.61 kg/m   Physical Examination:  General: Awake, alert, well nourished, No acute distress HEENT: Normal    Neck: No masses palpated. No lymphadenopathy; no goiter    Eyes: PERRLA, extraocular membranes intact, sclera white    Nose: nasal turbinates moist, no nasal discharge    Throat: moist mucus membranes, mild oropharyngeal erythema, no tonsillar  exudate.  Airway is patent Cardio: regular rate and rhythm, S1S2 heard, no murmurs appreciated Pulm: clear to auscultation bilaterally, no wheezes, rhonchi or rales; normal work of breathing on room air GI: flat, soft, mild generalized TTP. No peritoneal signs. Non-distended, bowel sounds present x4, no hepatomegaly, no splenomegaly, no masses Extremities: warm, well perfused, No edema, cyanosis or clubbing; +2 pulses bilaterally MSK: normal gait and normal station Skin: dry; intact; no rashes or lesions  Assessment/ Plan: 57 y.o. female   1. Sore throat Rapid strep was negative.  Patient is afebrile nontoxic-appearing.  Physical exam was remarkable for mild oropharyngeal erythema.  No reflux symptoms or URI symptoms. - Rapid Strep Screen ([facility] & [facility] ONLY)  2. Tick bite, initial encounter She sustained multiple tick bites.  Given history of alpha gal, concomitant generalized abdominal pain, fatigue and arthralgias, I have elected to empirically cover her for tickborne illness with doxycycline 100 mg p.o. twice daily.  I have ordered Lyme and [facility] spotted fever labs.  I will contact patient once these have resulted. - Lyme Ab/Western Blot Reflex - Rocky mtn spotted fvr abs pnl(IgG+IgM)  3. Fatigue, unspecified type Possibly related to tickborne illness versus thyroid disorder versus acute viral illness.  Will check labs as below. - CMP14+EGFR - CBC with Differential - TSH  4. Lower abdominal pain See above  5. Screening for lipid disorders We will obtain screening lipid panel with other blood labs in anticipation for annual visit with her PCP. - Lipid Panel   Orders Placed This Encounter  Procedures  . Rapid Strep Screen ([facility] & [facility] ONLY)  . CMP14+EGFR  . CBC with Differential  . TSH  . Lyme Ab/Western Blot Reflex  . Rocky mtn spotted fvr abs pnl(IgG+IgM)  . Lipid Panel   Meds ordered this encounter  Medications  . doxycycline (VIBRA-TABS) 100 MG tablet    Sig: Take  1 tablet (100 mg total) by mouth 2 (two) times daily for 14 days.    Dispense:  28 tablet    Refill:  [place], DO [place] [phone]

## 2018-05-10 NOTE — Patient Instructions (Signed)
You had labs performed today.  You will be contacted with the results of the labs once they are available, usually in the next 3 business days for routine lab work.  Tick labs may take up to 7 business days.  Strep is negative.  If sore throat or abdominal pain persists, please seek medical attention.  [place] Spotted Fever [place] spotted fever is an illness that is spread to people by infected ticks. The illness causes flulike symptoms and a reddish-purple rash. This illness can quickly become very serious. Treatment must be started right away. When the illness is not treated right away, it can sometimes lead to long-term health problems or even death. This illness is most common during warm weather when ticks are most active. What are the causes? [facility] spotted fever is caused by a type of bacteria that is called Rickettsia rickettsii. This type of bacteria is carried by [place] dog ticks and [employer]. People get infected through a bite from a tick that is infected with the bacteria. The bite is painless, and it frequently goes unnoticed. The bacteria can also infect a person when tick blood or tick feces get into a person's body through damaged skin. A tick bite is not necessary for an infection to occur. People can get [facility] spotted fever if they get a tick's blood or body fluids on their skin in the area of a small cut or sore. This could happen while removing a tick from another person or a dog. The infection is not contagious, and it cannot be spread (transmitted) from person to person. What are the signs or symptoms? Symptoms may begin 2-14 days after a tick bite. The most common early symptoms are:  Fever.  Muscle aches.  Headache.  Nausea.  Vomiting.  Poor appetite.  Abdominal pain.  The reddish-purple rash usually appears 3-5 days after the first symptoms begin. The rash often starts on the wrists and ankles. It may then spread  to the palms, the soles of the feet, the legs, and the trunk. How is this diagnosed? Diagnosis is based on a physical exam, medical history, and blood tests. Your health care provider may suspect [facility] spotted fever in one of these cases:  If you have recently been bitten by a tick.  If you have been in areas that have a lot of ticks or in areas where the disease is common.  How is this treated? It is important to begin treatment right away. Treatment will usually involve the use of antibiotic medicines. In some cases, your health care provider may begin treatment before the diagnosis is confirmed. If your symptoms are severe, a hospital stay may be needed. Follow these instructions at home:  Rest as much as possible until you feel better.  Take medicines only as directed by your health care provider.  Take your antibiotic medicine as directed by your health care provider. Finish the antibiotic even if you start to feel better.  Drink enough fluid to keep your urine clear or pale yellow.  Keep all follow-up visits as directed by your health care provider. This is important. How is this prevented? Avoiding tick bites can help to prevent this illness. Take these steps to avoid tick bites when you are outdoors:  Be aware that most ticks live in shrubs, low tree branches, and grassy areas. A tick can climb onto your body when you make contact with leaves or grass where the tick is waiting.  Wear protective clothing. Long sleeves and long pants are best.  Wear white clothes so you can see ticks more easily.  Tuck your pant legs into your socks.  If you go walking on a trail, stay in the middle of the trail to avoid brushing against bushes.  Avoid walking through areas that have long grass.  Put insect repellent on all exposed skin and along boot tops, pant legs, and sleeve cuffs.  Check clothing, hair, and skin repeatedly and before going inside.  Check family members and  pets for ticks.  Brush off any ticks that are not attached.  Take a shower or a bath as soon as possible after you have been outdoors. Check your skin for ticks. The most common places on the body where ticks attach themselves are the scalp, neck, armpits, waist, and groin.  You can also greatly reduce your chances of getting [facility] fever if you remove attached ticks as soon as possible. To remove an attached tick, use a forceps or fine-point tweezers to detach the intact tick without leaving its mouth parts in the skin. The wound from the tick bite should be washed after the tick has been removed. Contact a health care provider if:  You have drainage, swelling, or increased redness or pain in the area of the rash. Get help right away if:  You have chest pain.  You have shortness of breath.  You have a severe headache.  You have a seizure.  You have severe abdominal pain.  You are feeling confused.  You are bruising easily.  You have bleeding from your gums.  You have blood in your stool. This information is not intended to replace advice given to you by your health care provider. Make sure you discuss any questions you have with your health care provider. Document Released: 02/13/2001 Document Revised: 04/08/2016 Document Reviewed: 06/17/2014 Elsevier Interactive Patient Education  2018 [employer]  Lyme Disease Lyme disease is an infection that affects many parts of the body, including the skin, joints, and nervous system. It is a bacterial infection that starts from the bite of an infected tick. The infection can spread, and some of the symptoms are similar to the flu. If Lyme disease is not treated, it may cause joint pain, swelling, numbness, problems thinking, fatigue, muscle weakness, and other problems. What are the causes? This condition is caused by bacteria called Borrelia burgdorferi. You can get Lyme disease by being bitten by an infected tick. The  tick must be attached to your skin to pass along the infection. Deer often carry infected ticks. What increases the risk? The following factors may make you more likely to develop this condition:  Living in or visiting these areas in the [place] ? [place]. ? The [place]. ? [place].  Spending time in wooded or grassy areas.  Being outdoors with exposed skin.  Camping, gardening, hiking, fishing, or hunting outdoors.  Failing to remove a tick from your skin within 3-4 days.  What are the signs or symptoms? Symptoms of this condition include:  A round, red rash that surrounds the center of the tick bite. This is the first sign of infection. The center of the rash may be blood colored or have tiny blisters.  Fatigue.  Headache.  Chills and fever.  General achiness.  Joint pain, often in the knees.  Muscle pain.  Swollen lymph glands.  Stiff neck.  How is this diagnosed? This condition is diagnosed based  on:  Your symptoms and medical history.  A physical exam.  A blood test.  How is this treated? The main treatment for this condition is antibiotic medicine, which is usually taken by mouth (orally). The length of treatment depends on how soon after a tick bite you begin taking the medicine. In some cases, treatment is necessary for several weeks. If the infection is severe, antibiotics may need to be given through an IV tube that is inserted into one of your veins. Follow these instructions at home:  Take your antibiotic medicine as told by your health care provider. Do not stop taking the antibiotic even if you start to feel better.  Ask your health care provider about takinga probiotic in between doses of your antibiotic to help avoid stomach upset or diarrhea.  Check with your health care provider before supplementing your treatment. Many alternative therapies have not been proven and may be harmful to you.  Keep all follow-up visits as  told by your health care provider. This is important. How is this prevented? You can become reinfected if you get another tick bite from an infected tick. Take these steps to help prevent an infection:  Cover your skin with light-colored clothing when you are outdoors in the spring and summer months.  Spray clothing and skin with bug spray. The spray should be 20-30% DEET.  Avoid wooded, grassy, and shaded areas.  Remove yard litter, brush, trash, and plants that attract deer and rodents.  Check yourself for ticks when you come indoors.  Wash clothing worn each day.  Check your pets for ticks before they come inside.  If you find a tick: ? Remove it with tweezers. ? Clean your hands and the bite area with rubbing alcohol or soap and water.  Pregnant women should take special care to avoid tick bites because the infection can be passed along to the fetus. Contact a health care provider if:  You have symptoms after treatment.  You have removed a tick and want to bring it to your health care provider for testing. Get help right away if:  You have an irregular heartbeat.  You have nerve pain.  Your face feels numb. This information is not intended to replace advice given to you by your health care provider. Make sure you discuss any questions you have with your health care provider. Document Released: 02/07/2001 Document Revised: 06/22/2016 Document Reviewed: 06/22/2016 Elsevier Interactive Patient Education  2018 [employer].

## 2018-05-12 LAB — CBC WITH DIFFERENTIAL/PLATELET
Basophils Absolute: 0 10*3/uL (ref 0.0–0.2)
Basos: 1 %
EOS (ABSOLUTE): 0.1 10*3/uL (ref 0.0–0.4)
Eos: 2 %
HEMOGLOBIN: 13.1 g/dL (ref 11.1–15.9)
Hematocrit: 38.9 % (ref 34.0–46.6)
IMMATURE GRANULOCYTES: 0 %
Immature Grans (Abs): 0 10*3/uL (ref 0.0–0.1)
LYMPHS ABS: 2.1 10*3/uL (ref 0.7–3.1)
Lymphs: 35 %
MCH: 30.3 pg (ref 26.6–33.0)
MCHC: 33.7 g/dL (ref 31.5–35.7)
MCV: 90 fL (ref 79–97)
MONOCYTES: 7 %
Monocytes Absolute: 0.4 10*3/uL (ref 0.1–0.9)
NEUTROS PCT: 55 %
Neutrophils Absolute: 3.3 10*3/uL (ref 1.4–7.0)
Platelets: 276 10*3/uL (ref 150–450)
RBC: 4.33 x10E6/uL (ref 3.77–5.28)
RDW: 12.5 % (ref 12.3–15.4)
WBC: 6 10*3/uL (ref 3.4–10.8)

## 2018-05-12 LAB — CMP14+EGFR
ALBUMIN: 4.2 g/dL (ref 3.5–5.5)
ALT: 15 IU/L (ref 0–32)
AST: 17 IU/L (ref 0–40)
Albumin/Globulin Ratio: 2.1 (ref 1.2–2.2)
Alkaline Phosphatase: 51 IU/L (ref 39–117)
BUN/Creatinine Ratio: 21 (ref 9–23)
BUN: 15 mg/dL (ref 6–24)
Bilirubin Total: 0.8 mg/dL (ref 0.0–1.2)
CHLORIDE: 102 mmol/L (ref 96–106)
CO2: 24 mmol/L (ref 20–29)
CREATININE: 0.72 mg/dL (ref 0.57–1.00)
Calcium: 9.5 mg/dL (ref 8.7–10.2)
GFR calc Af Amer: 108 mL/min/{1.73_m2} (ref >59)
GFR calc non Af Amer: 93 mL/min/{1.73_m2} (ref >59)
GLOBULIN, TOTAL: 2 g/dL (ref 1.5–4.5)
Glucose: 82 mg/dL (ref 65–99)
Potassium: 4.5 mmol/L (ref 3.5–5.2)
Sodium: 140 mmol/L (ref 134–144)
TOTAL PROTEIN: 6.2 g/dL (ref 6.0–8.5)

## 2018-05-12 LAB — LIPID PANEL
CHOL/HDL RATIO: 2.9 ratio (ref 0.0–4.4)
Cholesterol, Total: 186 mg/dL (ref 100–199)
HDL: 65 mg/dL (ref >39)
LDL CALC: 104 mg/dL — AB (ref 0–99)
TRIGLYCERIDES: 85 mg/dL (ref 0–149)
VLDL Cholesterol Cal: 17 mg/dL (ref 5–40)

## 2018-05-12 LAB — ROCKY MTN SPOTTED FVR ABS PNL(IGG+IGM)
RMSF IGG: NEGATIVE
RMSF IgM: 0.21 index (ref 0.00–0.89)

## 2018-05-12 LAB — LYME AB/WESTERN BLOT REFLEX
LYME DISEASE AB, QUANT, IGM: <0.8 index (ref 0.00–0.79)
Lyme IgG/IgM Ab: <0.91 {ISR} (ref 0.00–0.90)

## 2018-05-12 LAB — TSH: TSH: 0.176 u[IU]/mL — AB (ref 0.450–4.500)

## 2018-06-28 ENCOUNTER — Ambulatory Visit: Payer: BC Managed Care – PPO | Admitting: Nurse Practitioner

## 2018-06-28 ENCOUNTER — Encounter: Payer: Self-pay | Admitting: Nurse Practitioner

## 2018-06-28 VITALS — BP 94/65 | HR 68 | Temp 98.3°F | Ht 62.0 in | Wt 124.0 lb

## 2018-06-28 DIAGNOSIS — Z91018 Allergy to other foods: Secondary | ICD-10-CM | POA: Diagnosis not present

## 2018-06-28 DIAGNOSIS — I200 Unstable angina: Secondary | ICD-10-CM | POA: Diagnosis not present

## 2018-06-28 DIAGNOSIS — Z1211 Encounter for screening for malignant neoplasm of colon: Secondary | ICD-10-CM

## 2018-06-28 DIAGNOSIS — F411 Generalized anxiety disorder: Secondary | ICD-10-CM | POA: Diagnosis not present

## 2018-06-28 DIAGNOSIS — E039 Hypothyroidism, unspecified: Secondary | ICD-10-CM

## 2018-06-28 DIAGNOSIS — E782 Mixed hyperlipidemia: Secondary | ICD-10-CM

## 2018-06-28 DIAGNOSIS — Z1212 Encounter for screening for malignant neoplasm of rectum: Secondary | ICD-10-CM

## 2018-06-28 MED ORDER — THYROID 65 MG PO TABS: 65.0000 mg | ORAL_TABLET | Freq: Every day | ORAL | 1 refills | Status: DC

## 2018-06-28 NOTE — Patient Instructions (Signed)
Your doctor has prescribed Cologuard, an easy-to-use, noninvasive test for colon cancer screening, based on the latest advances in stool DNA science.   Here's what will happen next:  1. You may receive a call or email from [facility] to confirm your mailing address and insurance information 2. Your kit will be shipped directly to you 3. You collet your stool sample in the privacy of your own home 4. You return the kit via UPS prepaid shipping or pick-up, in the same box it arrived in 5. You doctor will contact you with the results once they are available  Screening for colon cancer is very important to your good health, so if you have any questions at all, please call [facility] Customer Support Specialists at [phone]. They are available 24 hours a day, 6 days a week.    

## 2018-06-28 NOTE — Progress Notes (Signed)
Subjective:    Patient ID: [name], female    DOB: 07-15-1961, 57 y.o.   MRN: [id number]   Chief Complaint: Medical Management of Chronic Issues   HPI:  1. Mixed hyperlipidemia  Currently not on statin. Watches diet and does some exercise at least 3-4 days a week.  2. Anxiety state  Currently on prozac which works well to keep her from worrying so much.  3. Allergy to alpha-gal  Meat allergy- has epipen when hsa emergency.  4. Unstable angina ([facility])  No recent chest pain    Outpatient Encounter Medications as of 06/28/2018  Medication Sig  . cholecalciferol (VITAMIN D) 400 units TABS tablet Take 400 Units by mouth.  . Cyanocobalamin (B-12 PO) Take by mouth.  . EPINEPHrine 0.15 MG/0.15ML IJ injection Inject 0.15 mLs (0.15 mg total) into the muscle as needed for anaphylaxis.  Marland Kitchen estradiol (VIVELLE-DOT) 0.075 MG/24HR   . FLUoxetine (PROZAC) 20 MG capsule Take 20 mg by mouth daily.  . Magnesium 400 MG CAPS Take by mouth.  . Multiple Vitamin (MULTIVITAMIN) tablet Take 1 tablet by mouth daily.  . nitroGLYCERIN (NITROSTAT) 0.4 MG SL tablet Place 1 tablet (0.4 mg total) under the tongue every 5 (five) minutes as needed for chest pain.  . Nutritional Supplements (DHEA PO) Take by mouth.  . progesterone (PROMETRIUM) 100 MG capsule Take 100 mg by mouth daily.  . TESTOSTERONE COMPOUNDING KIT 20 % CREA Place onto the skin. nightly  . thyroid (ARMOUR) 65 MG tablet Take 65 mg daily by mouth.  Marland Kitchen VITAMIN A PO Take by mouth.  Marland Kitchen VITAMIN K PO Take by mouth.  Marland Kitchen ZINC GLUCONATE PO Take by mouth.     New complaints: None today  Social history: Recently married   Review of Systems  Constitutional: Negative for activity change and appetite change.  HENT: Negative.   Eyes: Negative for pain.  Respiratory: Negative for shortness of breath.   Cardiovascular: Negative for chest pain, palpitations and leg swelling.  Gastrointestinal: Negative for abdominal pain.  Endocrine: Negative  for polydipsia.  Genitourinary: Negative.   Skin: Negative for rash.  Neurological: Negative for dizziness, weakness and headaches.  Hematological: Does not bruise/bleed easily.  Psychiatric/Behavioral: Negative.   All other systems reviewed and are negative.      Objective:   Physical Exam  Constitutional: She is oriented to person, place, and time. She appears well-developed and well-nourished. No distress.  HENT:  Head: Normocephalic.  Nose: Nose normal.  Mouth/Throat: Oropharynx is clear and moist.  Eyes: Pupils are equal, round, and reactive to light. EOM are normal.  Neck: Normal range of motion. Neck supple. No JVD present. Carotid bruit is not present.  Cardiovascular: Normal rate, regular rhythm, normal heart sounds and intact distal pulses.  Pulmonary/Chest: Effort normal and breath sounds normal. No respiratory distress. She has no wheezes. She has no rales. She exhibits no tenderness.  Abdominal: Soft. Normal appearance, normal aorta and bowel sounds are normal. She exhibits no distension, no abdominal bruit, no pulsatile midline mass and no mass. There is no splenomegaly or hepatomegaly. There is no tenderness.  Musculoskeletal: Normal range of motion. She exhibits no edema.  Lymphadenopathy:    She has no cervical adenopathy.  Neurological: She is alert and oriented to person, place, and time. She has normal reflexes.  Skin: Skin is warm and dry.  Psychiatric: She has a normal mood and affect. Her behavior is normal. Judgment and thought content normal.   BP 94/65  Pulse 68   Temp 98.3 F (36.8 C) (Oral)   Ht _0  (1.575 m)   Wt 124 lb (56.2 kg)   BMI 22.68 kg/m       Assessment & Plan:  [name] comes in today with chief complaint of Medical Management of Chronic Issues   Diagnosis and orders addressed:  1. Mixed hyperlipidemia Low fat diet and exercise  2. Anxiety state Stress management  3. Allergy to alpha-gal Continue to avoid red  meat  4. Unstable angina ([place])  5. Encounter for colorectal cancer screening - Cologuard   Labs pending Health Maintenance reviewed Diet and exercise encouraged  Follow up plan: 6 months   Ariel Hassell Done, FNP

## 2018-06-29 LAB — THYROID PANEL WITH TSH
Free Thyroxine Index: 1.6 (ref 1.2–4.9)
T3 Uptake Ratio: 27 % (ref 24–39)
T4 TOTAL: 5.8 ug/dL (ref 4.5–12.0)
TSH: 0.476 u[IU]/mL (ref 0.450–4.500)

## 2018-06-29 LAB — CMP14+EGFR
A/G RATIO: 2 (ref 1.2–2.2)
ALBUMIN: 4.2 g/dL (ref 3.5–5.5)
ALT: 25 IU/L (ref 0–32)
AST: 20 IU/L (ref 0–40)
Alkaline Phosphatase: 58 IU/L (ref 39–117)
BILIRUBIN TOTAL: 0.8 mg/dL (ref 0.0–1.2)
BUN / CREAT RATIO: 16 (ref 9–23)
BUN: 12 mg/dL (ref 6–24)
CHLORIDE: 103 mmol/L (ref 96–106)
CO2: 25 mmol/L (ref 20–29)
Calcium: 9.4 mg/dL (ref 8.7–10.2)
Creatinine, Ser: 0.74 mg/dL (ref 0.57–1.00)
GFR calc non Af Amer: 90 mL/min/{1.73_m2} (ref >59)
GFR, EST AFRICAN AMERICAN: 104 mL/min/{1.73_m2} (ref >59)
Globulin, Total: 2.1 g/dL (ref 1.5–4.5)
Glucose: 86 mg/dL (ref 65–99)
POTASSIUM: 4.4 mmol/L (ref 3.5–5.2)
Sodium: 140 mmol/L (ref 134–144)
TOTAL PROTEIN: 6.3 g/dL (ref 6.0–8.5)

## 2018-06-29 LAB — LIPID PANEL
Chol/HDL Ratio: 2.8 ratio (ref 0.0–4.4)
Cholesterol, Total: 203 mg/dL — ABNORMAL HIGH (ref 100–199)
HDL: 72 mg/dL (ref >39)
LDL Calculated: 110 mg/dL — ABNORMAL HIGH (ref 0–99)
Triglycerides: 103 mg/dL (ref 0–149)
VLDL Cholesterol Cal: 21 mg/dL (ref 5–40)

## 2018-08-24 ENCOUNTER — Encounter (HOSPITAL_COMMUNITY): Payer: Self-pay | Admitting: Emergency Medicine

## 2018-08-24 ENCOUNTER — Emergency Department (HOSPITAL_COMMUNITY): Payer: BC Managed Care – PPO

## 2018-08-24 ENCOUNTER — Other Ambulatory Visit: Payer: Self-pay

## 2018-08-24 ENCOUNTER — Emergency Department (HOSPITAL_COMMUNITY)
Admission: EM | Admit: 2018-08-24 | Discharge: 2018-08-24 | Disposition: A | Discharge status: Home / Self Care | Payer: BC Managed Care – PPO | Attending: Emergency Medicine | Admitting: Emergency Medicine

## 2018-08-24 DIAGNOSIS — Z79899 Other long term (current) drug therapy: Secondary | ICD-10-CM | POA: Insufficient documentation

## 2018-08-24 DIAGNOSIS — R0789 Other chest pain: Secondary | ICD-10-CM | POA: Insufficient documentation

## 2018-08-24 DIAGNOSIS — R079 Chest pain, unspecified: Secondary | ICD-10-CM | POA: Diagnosis present

## 2018-08-24 HISTORY — DX: Allergy to other foods: Z91.018

## 2018-08-24 HISTORY — DX: Infectious mononucleosis, unspecified without complication: B27.90

## 2018-08-24 HISTORY — DX: Lyme disease, unspecified: A69.20

## 2018-08-24 LAB — DIFFERENTIAL
BAND NEUTROPHILS: 0 %
BASOS ABS: 0.1 10*3/uL (ref 0.0–0.1)
BASOS PCT: 1 %
Blasts: 0 %
EOS ABS: 0.1 10*3/uL (ref 0.0–0.5)
Eosinophils Relative: 2 %
Lymphocytes Relative: 38 %
Lymphs Abs: 2.3 10*3/uL (ref 0.7–4.0)
MONO ABS: 0.5 10*3/uL (ref 0.1–1.0)
MONOS PCT: 8 %
MYELOCYTES: 0 %
Metamyelocytes Relative: 0 %
NEUTROS ABS: 3.1 10*3/uL (ref 1.7–7.7)
Neutrophils Relative %: 51 %
Other: 0 %
PROMYELOCYTES RELATIVE: 0 %
nRBC: 0 /100 WBC

## 2018-08-24 LAB — BASIC METABOLIC PANEL
ANION GAP: 9 (ref 5–15)
BUN: 13 mg/dL (ref 6–20)
CALCIUM: 9.4 mg/dL (ref 8.9–10.3)
CO2: 27 mmol/L (ref 22–32)
Chloride: 104 mmol/L (ref 98–111)
Creatinine, Ser: 0.75 mg/dL (ref 0.44–1.00)
GFR calc non Af Amer: >60 mL/min (ref >60)
Glucose, Bld: 90 mg/dL (ref 70–99)
Potassium: 4 mmol/L (ref 3.5–5.1)
Sodium: 140 mmol/L (ref 135–145)

## 2018-08-24 LAB — CBC
HCT: 42.9 % (ref 36.0–46.0)
Hemoglobin: 14 g/dL (ref 12.0–15.0)
MCH: 30.8 pg (ref 26.0–34.0)
MCHC: 32.6 g/dL (ref 30.0–36.0)
MCV: 94.5 fL (ref 80.0–100.0)
PLATELETS: 293 10*3/uL (ref 150–400)
RBC: 4.54 MIL/uL (ref 3.87–5.11)
RDW: 12.3 % (ref 11.5–15.5)
WBC: 6.1 10*3/uL (ref 4.0–10.5)
nRBC: 0 % (ref 0.0–0.2)

## 2018-08-24 LAB — HEPATIC FUNCTION PANEL
ALBUMIN: 3.8 g/dL (ref 3.5–5.0)
ALT: 23 U/L (ref 0–44)
AST: 23 U/L (ref 15–41)
Alkaline Phosphatase: 51 U/L (ref 38–126)
Bilirubin, Direct: 0.1 mg/dL (ref 0.0–0.2)
Indirect Bilirubin: 0.9 mg/dL (ref 0.3–0.9)
TOTAL PROTEIN: 6.7 g/dL (ref 6.5–8.1)
Total Bilirubin: 1 mg/dL (ref 0.3–1.2)

## 2018-08-24 LAB — TROPONIN I: Troponin I: <0.03 ng/mL (ref <0.03)

## 2018-08-24 LAB — LIPASE, BLOOD: Lipase: 34 U/L (ref 11–51)

## 2018-08-24 MED ORDER — SODIUM CHLORIDE 0.9 % IV BOLUS
500.0000 mL | Freq: Once | INTRAVENOUS | Status: AC
  Administered 2018-08-24: 500 mL via INTRAVENOUS

## 2018-08-24 MED ORDER — GI COCKTAIL ~~LOC~~
30.0000 mL | Freq: Once | ORAL | Status: AC
  Administered 2018-08-24: 30 mL via ORAL
  Filled 2018-08-24: qty 30

## 2018-08-24 MED ORDER — PANTOPRAZOLE SODIUM 20 MG PO TBEC: 20.0000 mg | DELAYED_RELEASE_TABLET | Freq: Every day | ORAL | 0 refills | Status: DC

## 2018-08-24 NOTE — ED Provider Notes (Signed)
[facility] EMERGENCY DEPARTMENT Provider Note   CSN: [id number] Arrival date & time: 08/24/18  0825     History   Chief Complaint Chief Complaint  Patient presents with  . Chest Pain    HPI Ariel FERGESON is a 57 y.o. female.  Patient complains of epigastric discomfort and some chest discomfort.  No fever no chills no shortness of breath  The history is provided by the patient. No language interpreter was used.  Chest Pain   This is a new problem. The current episode started 6 to 12 hours ago. The problem occurs constantly. The problem has not changed since onset.The pain is associated with rest. The pain is present in the substernal region. The pain is at a severity of 4/10. The pain is moderate. The quality of the pain is described as burning. The pain does not radiate. Exacerbated by: Nothing. Associated symptoms include abdominal pain. Pertinent negatives include no back pain, no cough and no headaches.  Pertinent negatives for past medical history include no seizures.    Past Medical History:  Diagnosis Date  . Allergy to alpha-gal   . Cataract   . [place] Patient virus infection   . Heart attack ([place])   . Idiopathic anaphylactic reaction   . Lyme disease     Patient Active Problem List   Diagnosis Date Noted  . Allergy to alpha-gal 06/28/2018  . Pill-induced gastritis 08/01/2016  . Unstable angina ([place]) 07/29/2016  . Hyperlipidemia 07/29/2016  . Anxiety state 01/15/2011    Past Surgical History:  Procedure Laterality Date  . ABDOMINAL HYSTERECTOMY    . CATARACT EXTRACTION W/PHACO Left 12/24/2013   Procedure: CATARACT EXTRACTION PHACO AND INTRAOCULAR LENS PLACEMENT (IOC);  Surgeon: Tonny Branch, MD;  Location: [facility];  Service: Ophthalmology;  Laterality: Left;  CDE:  10.84  . CATARACT EXTRACTION W/PHACO Right 01/07/2014   Procedure: CATARACT EXTRACTION PHACO AND INTRAOCULAR LENS PLACEMENT (IOC);  Surgeon: Tonny Branch, MD;  Location: [facility];  Service:  Ophthalmology;  Laterality: Right;  CDE:7.76  . TUBAL LIGATION       OB History   None      Home Medications    Prior to Admission medications   Medication Sig Start Date End Date Taking? Authorizing Provider  cholecalciferol (VITAMIN D) 400 units TABS tablet Take 400 Units by mouth.   Yes [provider]  Cyanocobalamin (B-12 PO) Take 1 tablet by mouth daily.    Yes [provider]  EPINEPHrine 0.15 MG/0.15ML IJ injection Inject 0.15 mLs (0.15 mg total) into the muscle as needed for anaphylaxis. 02/02/16  Yes Martin, [name], FNP  estradiol (VIVELLE-DOT) 0.075 MG/24HR Place 1 patch onto the skin 2 (two) times a week.  04/19/18  Yes [provider]  FLUoxetine (PROZAC) 20 MG capsule Take 20 mg by mouth 2 (two) times daily.    Yes [provider]  Magnesium 400 MG CAPS Take 1 capsule by mouth daily.    Yes [provider]  Multiple Vitamin (MULTIVITAMIN) tablet Take 1 tablet by mouth daily.   Yes [provider]  Nutritional Supplements (DHEA PO) Take 1 tablet by mouth daily.    Yes [provider]  progesterone (PROMETRIUM) 100 MG capsule Take 100 mg by mouth daily.   Yes [provider]  TESTOSTERONE COMPOUNDING KIT 20 % CREA Place onto the skin. nightly   Yes [provider]  thyroid (ARMOUR) 65 MG tablet Take 1 tablet (65 mg total) by mouth daily. 06/28/18  Yes Martin, [name], FNP  VITAMIN A PO Take 1 tablet by mouth daily.    Yes [provider]  VITAMIN K PO Take 1 tablet by mouth daily.    Yes [provider]  ZINC GLUCONATE PO Take 1 tablet by mouth daily.    Yes [provider]  nitroGLYCERIN (NITROSTAT) 0.4 MG SL tablet Place 1 tablet (0.4 mg total) under the tongue every 5 (five) minutes as needed for chest pain. 07/29/16   Lavina Hamman, MD  pantoprazole (PROTONIX) 20 MG tablet Take 1 tablet (20 mg total) by mouth daily. 08/24/18   Milton Ferguson, MD     Family History Family History  Problem Relation Age of Onset  . Graves' disease Mother   . CAD Father     Social History Social History   Tobacco Use  . Smoking status: Never Smoker  . Smokeless tobacco: Never Used  Substance Use Topics  . Alcohol use: Yes    Comment: wine occasionally  . Drug use: No     Allergies   Cheese; Mango flavor; Meat extract; and Meperidine hcl   Review of Systems Review of Systems  Constitutional: Negative for appetite change and fatigue.  HENT: Negative for congestion, ear discharge and sinus pressure.   Eyes: Negative for discharge.  Respiratory: Negative for cough.   Cardiovascular: Positive for chest pain.  Gastrointestinal: Positive for abdominal pain. Negative for diarrhea.  Genitourinary: Negative for frequency and hematuria.  Musculoskeletal: Negative for back pain.  Skin: Negative for rash.  Neurological: Negative for seizures and headaches.  Psychiatric/Behavioral: Negative for hallucinations.     Physical Exam Updated Vital Signs BP 123/74   Pulse 66   Temp 97.7 F (36.5 C) (Oral)   Resp 15   Ht '5\' 2"'$  (1.575 m)   Wt 57.6 kg   SpO2 100%   BMI 23.23 kg/m   Physical Exam  Constitutional: She is oriented to person, place, and time. She appears well-developed.  HENT:  Head: Normocephalic.  Eyes: Conjunctivae and EOM are normal. No scleral icterus.  Neck: Neck supple. No thyromegaly present.  Cardiovascular: Normal rate and regular rhythm. Exam reveals no gallop and no friction rub.  No murmur heard. Pulmonary/Chest: No stridor. She has no wheezes. She has no rales. She exhibits no tenderness.  Abdominal: She exhibits no distension. There is tenderness. There is no rebound.  Tender epigastric  Musculoskeletal: Normal range of motion. She exhibits no edema.  Lymphadenopathy:    She has no cervical adenopathy.  Neurological: She is oriented to person, place, and time. She exhibits normal muscle tone. Coordination  normal.  Skin: No rash noted. No erythema.  Psychiatric: She has a normal mood and affect. Her behavior is normal.     ED Treatments / Results  Labs (all labs ordered are listed, but only abnormal results are displayed) Labs Reviewed  BASIC METABOLIC PANEL  CBC  TROPONIN I  HEPATIC FUNCTION PANEL  LIPASE, BLOOD  DIFFERENTIAL  TROPONIN I    EKG EKG Interpretation  Date/Time:  Thursday August 24 2018 08:33:04 EDT Ventricular Rate:  64 PR Interval:    QRS Duration: 97 QT Interval:  390 QTC Calculation: 403 R Axis:   89 Text Interpretation:  Sinus rhythm Atrial premature complex Confirmed by Milton Garza [phone]) on 08/24/2018 8:51:26 AM   Radiology Dg Chest 2 View  Result Date: 08/24/2018 CLINICAL DATA:  Mid chest pain that started this morning EXAM: CHEST - 2 VIEW COMPARISON:  07/29/2016 FINDINGS: Normal  heart size and mediastinal contours. No acute infiltrate or edema. No effusion or pneumothorax. No acute osseous findings. IMPRESSION: Negative chest. Electronically Signed   By: Monte Fantasia M.D.   On: 08/24/2018 09:47    Procedures Procedures (including critical care time)  Medications Ordered in ED Medications  sodium chloride 0.9 % bolus 500 mL (0 mLs Intravenous Stopped 08/24/18 1205)  gi cocktail (Maalox,Lidocaine,Donnatal) (30 mLs Oral Given 08/24/18 0951)     Initial Impression / Assessment and Plan / ED Course  I have reviewed the triage vital signs and the nursing notes.  Pertinent labs & imaging results that were available during my care of the patient were reviewed by me and considered in my medical decision making (see chart for details).     Patient symptoms improved with GI cocktail.  She has 2- troponins.  Doubt coronary artery disease causing pain.  Suspect GERD.  Patient will be placed on Protonix will follow-up with her PCP  Final Clinical Impressions(s) / ED Diagnoses   Final diagnoses:  Atypical chest pain    ED Discharge Orders          Ordered    pantoprazole (PROTONIX) 20 MG tablet  Daily     08/24/18 1337           Milton Ferguson, MD 08/24/18 1341

## 2018-08-24 NOTE — ED Notes (Signed)
Pt asked to verify with pharmacy that the  GI cocktail does not have any mammal by products.  Spoke with pharmacist Jillyn Hidden, he states it does not have any mammal by products.

## 2018-08-24 NOTE — Discharge Instructions (Addendum)
Follow-up with your family doctor next week for recheck.  Return if any problems °

## 2018-08-24 NOTE — ED Triage Notes (Signed)
Pt c/o sudden onset of LT sided CP that will intermittently radiate to LT shoulder. Started approx 45 min ago. Endorses mild dizziness. States she took 81 mg ASA with no relief PTA.

## 2018-11-30 ENCOUNTER — Encounter: Payer: Self-pay | Admitting: *Deleted

## 2018-12-26 ENCOUNTER — Ambulatory Visit: Payer: BC Managed Care – PPO | Admitting: Nurse Practitioner

## 2018-12-26 ENCOUNTER — Ambulatory Visit (INDEPENDENT_AMBULATORY_CARE_PROVIDER_SITE_OTHER): Payer: BC Managed Care – PPO

## 2018-12-26 ENCOUNTER — Encounter: Payer: Self-pay | Admitting: Nurse Practitioner

## 2018-12-26 VITALS — BP 108/71 | HR 65 | Temp 96.8°F | Ht 62.0 in | Wt 136.0 lb

## 2018-12-26 DIAGNOSIS — E782 Mixed hyperlipidemia: Secondary | ICD-10-CM

## 2018-12-26 DIAGNOSIS — M5441 Lumbago with sciatica, right side: Secondary | ICD-10-CM | POA: Diagnosis not present

## 2018-12-26 DIAGNOSIS — Z91018 Allergy to other foods: Secondary | ICD-10-CM | POA: Diagnosis not present

## 2018-12-26 DIAGNOSIS — E039 Hypothyroidism, unspecified: Secondary | ICD-10-CM

## 2018-12-26 DIAGNOSIS — F411 Generalized anxiety disorder: Secondary | ICD-10-CM

## 2018-12-26 MED ORDER — THYROID 65 MG PO TABS: 65.0000 mg | ORAL_TABLET | Freq: Every day | ORAL | 1 refills | Status: DC

## 2018-12-26 MED ORDER — PREDNISONE 20 MG PO TABS: ORAL_TABLET | ORAL | 0 refills | Status: DC

## 2018-12-26 MED ORDER — FLUOXETINE HCL 20 MG PO CAPS: 20.0000 mg | ORAL_CAPSULE | Freq: Two times a day (BID) | ORAL | 1 refills | Status: DC

## 2018-12-26 NOTE — Progress Notes (Signed)
Subjective:    Patient ID: [name], female    DOB: [birth year], 58 y.o.   MRN: [id number]   Chief Complaint: medical management of chronic issues  HPI:  1. Mixed hyperlipidemia  Watches diet and exercises often. Is currently on no statin  2. Allergy to alpha-gal  She has a very severs meat allergy. She has ben seeing specialist for this. She keeps epipen with her at all times. Had reaction from eating at [employer] and had to go to hospital last October.  3. Anxiety state  She takes prozac daily to keep herself calm. She says that she is doing well without any side effects.    Outpatient Encounter Medications as of 12/26/2018  Medication Sig  . cholecalciferol (VITAMIN D) 400 units TABS tablet Take 400 Units by mouth.  . Cyanocobalamin (B-12 PO) Take 1 tablet by mouth daily.   Marland Kitchen EPINEPHrine 0.15 MG/0.15ML IJ injection Inject 0.15 mLs (0.15 mg total) into the muscle as needed for anaphylaxis.  Marland Kitchen estradiol (VIVELLE-DOT) 0.075 MG/24HR Place 1 patch onto the skin 2 (two) times a week.   Marland Kitchen FLUoxetine (PROZAC) 20 MG capsule Take 20 mg by mouth 2 (two) times daily.   . Magnesium 400 MG CAPS Take 1 capsule by mouth daily.   . Multiple Vitamin (MULTIVITAMIN) tablet Take 1 tablet by mouth daily.  . nitroGLYCERIN (NITROSTAT) 0.4 MG SL tablet Place 1 tablet (0.4 mg total) under the tongue every 5 (five) minutes as needed for chest pain.  . Nutritional Supplements (DHEA PO) Take 1 tablet by mouth daily.   . pantoprazole (PROTONIX) 20 MG tablet Take 1 tablet (20 mg total) by mouth daily.  . progesterone (PROMETRIUM) 100 MG capsule Take 100 mg by mouth daily.  . TESTOSTERONE COMPOUNDING KIT 20 % CREA Place onto the skin. nightly  . thyroid (ARMOUR) 65 MG tablet Take 1 tablet (65 mg total) by mouth daily.  Marland Kitchen VITAMIN A PO Take 1 tablet by mouth daily.   Marland Kitchen VITAMIN K PO Take 1 tablet by mouth daily.   Marland Kitchen ZINC GLUCONATE PO Take 1 tablet by mouth daily.       New complaints: Has been  having hip and back pain. After she gets going she is ok. Back pain will occasionally radiate down to her knee.   Social history: Lives with her new husband- her husband died of cancer 2 years ago and she remarried shortly after that.   Review of Systems  Constitutional: Negative for activity change and appetite change.  HENT: Negative.   Eyes: Negative for pain.  Respiratory: Negative for shortness of breath.   Cardiovascular: Negative for chest pain, palpitations and leg swelling.  Gastrointestinal: Negative for abdominal pain.  Endocrine: Negative for polydipsia.  Genitourinary: Negative.   Skin: Negative for rash.  Neurological: Negative for dizziness, weakness and headaches.  Hematological: Does not bruise/bleed easily.  Psychiatric/Behavioral: Negative.   All other systems reviewed and are negative.      Objective:   Physical Exam Vitals signs and nursing note reviewed.  Constitutional:      General: She is not in acute distress.    Appearance: Normal appearance. She is well-developed.  HENT:     Head: Normocephalic.     Nose: Nose normal.  Eyes:     Pupils: Pupils are equal, round, and reactive to light.  Neck:     Musculoskeletal: Normal range of motion and neck supple.     Vascular: No carotid bruit or JVD.  Cardiovascular:     Rate and Rhythm: Normal rate and regular rhythm.     Heart sounds: Normal heart sounds.  Pulmonary:     Effort: Pulmonary effort is normal. No respiratory distress.     Breath sounds: Normal breath sounds. No wheezing or rales.  Chest:     Chest wall: No tenderness.  Abdominal:     General: Bowel sounds are normal. There is no distension or abdominal bruit.     Palpations: Abdomen is soft. There is no hepatomegaly, splenomegaly, mass or pulsatile mass.     Tenderness: There is no abdominal tenderness.  Musculoskeletal: Normal range of motion.     Comments: Mild pain on palpation right lower back. FROM with pain on flexion and  extension. (-) SLR bil Motor strength and sensation distally intact  Lymphadenopathy:     Cervical: No cervical adenopathy.  Skin:    General: Skin is warm and dry.  Neurological:     Mental Status: She is alert and oriented to person, place, and time.     Deep Tendon Reflexes: Reflexes are normal and symmetric.  Psychiatric:        Behavior: Behavior normal.        Thought Content: Thought content normal.        Judgment: Judgment normal.    BP 108/71   Pulse 65   Temp (!) 96.8 F (36 C) (Oral)   Ht 5' 2" (1.575 m)   Wt 136 lb (61.7 kg)   BMI 24.87 kg/m         Assessment & Plan:  [name] comes in today with chief complaint of Medical Management of Chronic Issues   Diagnosis and orders addressed:  1. Mixed hyperlipidemia Low fat diet - CMP14+EGFR - Lipid panel  2. Allergy to alpha-gal Keep epi pen within reach  3. Anxiety state stres management - FLUoxetine (PROZAC) 20 MG capsule; Take 1 capsule (20 mg total) by mouth 2 (two) times daily.  Dispense: 90 capsule; Refill: 1  4. Acute right-sided low back pain with right-sided sciatica Moist heat to lower back No heavy lifting - DG Lumbar Spine 2-3 Views; Future - predniSONE (DELTASONE) 20 MG tablet; 2 po at sametime daily for 5 days  Dispense: 10 tablet; Refill: 0  5. Acquired hypothyroidism - thyroid (ARMOUR) 65 MG tablet; Take 1 tablet (65 mg total) by mouth daily.  Dispense: 90 tablet; Refill: 1 - Thyroid Panel With TSH   Labs pending Health Maintenance reviewed Diet and exercise encouraged  Follow up plan: 6 months   [place], FNP

## 2018-12-26 NOTE — Patient Instructions (Signed)
Sciatica    Sciatica is pain, numbness, weakness, or tingling along your sciatic nerve. The sciatic nerve starts in the lower back and goes down the back of each leg. Sciatica happens when this nerve is pinched or has pressure put on it. Sciatica usually goes away on its own or with treatment. Sometimes, sciatica may keep coming back (recur).  Follow these instructions at home:  Medicines  · Take over-the-counter and prescription medicines only as told by your doctor.  · Do not drive or use heavy machinery while taking prescription pain medicine.  Managing pain  · If directed, put ice on the affected area.  ? Put ice in a plastic bag.  ? Place a towel between your skin and the bag.  ? Leave the ice on for 20 minutes, 2-3 times a day.  · After icing, apply heat to the affected area before you exercise or as often as told by your doctor. Use the heat source that your doctor tells you to use, such as a moist heat pack or a heating pad.  ? Place a towel between your skin and the heat source.  ? Leave the heat on for 20-30 minutes.  ? Remove the heat if your skin turns bright red. This is especially important if you are unable to feel pain, heat, or cold. You may have a greater risk of getting burned.  Activity  · Return to your normal activities as told by your doctor. Ask your doctor what activities are safe for you.  ? Avoid activities that make your sciatica worse.  · Take short rests during the day. Rest in a lying or standing position. This is usually better than sitting to rest.  ? When you rest for a long time, do some physical activity or stretching between periods of rest.  ? Avoid sitting for a long time without moving. Get up and move around at least one time each hour.  · Exercise and stretch regularly, as told by your doctor.  · Do not lift anything that is heavier than 10 lb (4.5 kg) while you have symptoms of sciatica.  ? Avoid lifting heavy things even when you do not have symptoms.  ? Avoid lifting  heavy things over and over.  · When you lift objects, always lift in a way that is safe for your body. To do this, you should:  ? Bend your knees.  ? Keep the object close to your body.  ? Avoid twisting.  General instructions  · Use good posture.  ? Avoid leaning forward when you are sitting.  ? Avoid hunching over when you are standing.  · Stay at a healthy weight.  · Wear comfortable shoes that support your feet. Avoid wearing high heels.  · Avoid sleeping on a mattress that is too soft or too hard. You might have less pain if you sleep on a mattress that is firm enough to support your back.  · Keep all follow-up visits as told by your doctor. This is important.  Contact a doctor if:  · You have pain that:  ? Wakes you up when you are sleeping.  ? Gets worse when you lie down.  ? Is worse than the pain you have had in the past.  ? Lasts longer than 4 weeks.  · You lose weight for without trying.  Get help right away if:  · You cannot control when you pee (urinate) or poop (have a bowel movement).  · You   have weakness in any of these areas and it gets worse.  ? Lower back.  ? Lower belly (pelvis).  ? Butt (buttocks).  ? Legs.  · You have redness or swelling of your back.  · You have a burning feeling when you pee.  This information is not intended to replace advice given to you by your health care provider. Make sure you discuss any questions you have with your health care provider.  Document Released: 08/10/2008 Document Revised: 04/08/2016 Document Reviewed: 07/11/2015  Elsevier Interactive Patient Education © 2019 [employer]

## 2018-12-27 LAB — CMP14+EGFR
A/G RATIO: 1.9 (ref 1.2–2.2)
ALBUMIN: 4.1 g/dL (ref 3.8–4.9)
ALT: 14 IU/L (ref 0–32)
AST: 21 IU/L (ref 0–40)
Alkaline Phosphatase: 55 IU/L (ref 39–117)
BUN / CREAT RATIO: 16 (ref 9–23)
BUN: 14 mg/dL (ref 6–24)
Bilirubin Total: 0.7 mg/dL (ref 0.0–1.2)
CALCIUM: 9.3 mg/dL (ref 8.7–10.2)
CO2: 25 mmol/L (ref 20–29)
Chloride: 102 mmol/L (ref 96–106)
Creatinine, Ser: 0.86 mg/dL (ref 0.57–1.00)
GFR, EST AFRICAN AMERICAN: 86 mL/min/{1.73_m2} (ref >59)
GFR, EST NON AFRICAN AMERICAN: 75 mL/min/{1.73_m2} (ref >59)
GLOBULIN, TOTAL: 2.2 g/dL (ref 1.5–4.5)
Glucose: 84 mg/dL (ref 65–99)
POTASSIUM: 4.6 mmol/L (ref 3.5–5.2)
SODIUM: 140 mmol/L (ref 134–144)
TOTAL PROTEIN: 6.3 g/dL (ref 6.0–8.5)

## 2018-12-27 LAB — LIPID PANEL
CHOL/HDL RATIO: 2.9 ratio (ref 0.0–4.4)
Cholesterol, Total: 225 mg/dL — ABNORMAL HIGH (ref 100–199)
HDL: 77 mg/dL (ref >39)
LDL Calculated: 134 mg/dL — ABNORMAL HIGH (ref 0–99)
Triglycerides: 69 mg/dL (ref 0–149)
VLDL Cholesterol Cal: 14 mg/dL (ref 5–40)

## 2018-12-27 LAB — THYROID PANEL WITH TSH
Free Thyroxine Index: 1.4 (ref 1.2–4.9)
T3 Uptake Ratio: 26 % (ref 24–39)
T4 TOTAL: 5.4 ug/dL (ref 4.5–12.0)
TSH: 0.56 u[IU]/mL (ref 0.450–4.500)

## 2018-12-29 ENCOUNTER — Ambulatory Visit: Payer: BC Managed Care – PPO | Admitting: Nurse Practitioner

## 2019-06-29 ENCOUNTER — Encounter: Payer: Self-pay | Admitting: Nurse Practitioner

## 2019-06-29 ENCOUNTER — Ambulatory Visit (INDEPENDENT_AMBULATORY_CARE_PROVIDER_SITE_OTHER): Payer: BC Managed Care – PPO | Admitting: Nurse Practitioner

## 2019-06-29 DIAGNOSIS — E782 Mixed hyperlipidemia: Secondary | ICD-10-CM | POA: Diagnosis not present

## 2019-06-29 DIAGNOSIS — F411 Generalized anxiety disorder: Secondary | ICD-10-CM

## 2019-06-29 DIAGNOSIS — Z91018 Allergy to other foods: Secondary | ICD-10-CM | POA: Diagnosis not present

## 2019-06-29 DIAGNOSIS — Z1231 Encounter for screening mammogram for malignant neoplasm of breast: Secondary | ICD-10-CM | POA: Diagnosis not present

## 2019-06-29 MED ORDER — FLUOXETINE HCL 20 MG PO CAPS: 20.0000 mg | ORAL_CAPSULE | Freq: Two times a day (BID) | ORAL | 1 refills | Status: DC

## 2019-06-29 NOTE — Progress Notes (Signed)
Virtual Visit via telephone Note Due to COVID-19 pandemic this visit was conducted virtually. This visit type was conducted due to national recommendations for restrictions regarding the COVID-19 Pandemic (e.g. social distancing, sheltering in place) in an effort to limit this patient's exposure and mitigate transmission in our community. All issues noted in this document were discussed and addressed.  A physical exam was not performed with this format.  I connected with [name] on 06/29/19 at 9:20 by telephone and verified that I am speaking with the correct person using two identifiers. [name] is currently located at home and no one is currently with her during visit. The provider, [name], FNP is located in their office at time of visit.  I discussed the limitations, risks, security and privacy concerns of performing an evaluation and management service by telephone and the availability of in person appointments. I also discussed with the patient that there may be a patient responsible charge related to this service. The patient expressed understanding and agreed to proceed.   History and Present Illness:   Chief Complaint: medical management of chronic issues   HPI:  1. Mixed hyperlipidemia patient is watching diet and exercises daily.   2. Anxiety state She is currently on prozac and says that she is doing fantastic  3. Allergy to alpha-gal Would like lab work done at next visit. She is continue to avoid red meat. Has had no recent reactions. She has had to use benadryl a couple of times for minor reactions.  4. Hypothyroidism She is on armour thyroid and is doing well.   Outpatient Encounter Medications as of 06/29/2019  Medication Sig  . cholecalciferol (VITAMIN D) 400 units TABS tablet Take 400 Units by mouth.  . EPINEPHrine 0.15 MG/0.15ML IJ injection Inject 0.15 mLs (0.15 mg total) into the muscle as needed for anaphylaxis.  Marland Kitchen estradiol  (VIVELLE-DOT) 0.075 MG/24HR Place 1 patch onto the skin 2 (two) times a week.   Marland Kitchen FLUoxetine (PROZAC) 20 MG capsule Take 1 capsule (20 mg total) by mouth 2 (two) times daily.  . Magnesium 400 MG CAPS Take 1 capsule by mouth daily.   . Multiple Vitamin (MULTIVITAMIN) tablet Take 1 tablet by mouth daily.  . nitroGLYCERIN (NITROSTAT) 0.4 MG SL tablet Place 1 tablet (0.4 mg total) under the tongue every 5 (five) minutes as needed for chest pain.  . Nutritional Supplements (DHEA PO) Take 1 tablet by mouth daily.   . predniSONE (DELTASONE) 20 MG tablet 2 po at sametime daily for 5 days  . progesterone (PROMETRIUM) 100 MG capsule Take 100 mg by mouth daily.  . TESTOSTERONE COMPOUNDING KIT 20 % CREA Place onto the skin. nightly  . thyroid (ARMOUR) 65 MG tablet Take 1 tablet (65 mg total) by mouth daily.  Marland Kitchen VITAMIN A PO Take 1 tablet by mouth daily.   Marland Kitchen VITAMIN D PO Take by mouth.  Marland Kitchen VITAMIN K PO Take 1 tablet by mouth daily.   Marland Kitchen ZINC GLUCONATE PO Take 1 tablet by mouth daily.      Past Surgical History:  Procedure Laterality Date  . ABDOMINAL HYSTERECTOMY    . CATARACT EXTRACTION W/PHACO Left 12/24/2013   Procedure: CATARACT EXTRACTION PHACO AND INTRAOCULAR LENS PLACEMENT (IOC);  Surgeon: Tonny Branch, MD;  Location: [facility];  Service: Ophthalmology;  Laterality: Left;  CDE:  10.84  . CATARACT EXTRACTION W/PHACO Right 01/07/2014   Procedure: CATARACT EXTRACTION PHACO AND INTRAOCULAR LENS PLACEMENT (IOC);  Surgeon: Tonny Branch, MD;  Location: [facility];  Service: Ophthalmology;  Laterality: Right;  CDE:7.76  . TUBAL LIGATION      Family History  Problem Relation Age of Onset  . Graves' disease Mother   . CAD Father     New complaints: None today  Social history: Has remarried since her husband died  Controlled substance contract: N/A    Review of Systems  Constitutional: Negative for diaphoresis and weight loss.  Eyes: Negative for blurred vision, double vision and pain.  Respiratory:  Negative for shortness of breath.   Cardiovascular: Negative for chest pain, palpitations, orthopnea and leg swelling.  Gastrointestinal: Negative for abdominal pain.  Skin: Negative for rash.  Neurological: Negative for dizziness, sensory change, loss of consciousness, weakness and headaches.  Endo/Heme/Allergies: Negative for polydipsia. Does not bruise/bleed easily.  Psychiatric/Behavioral: Negative for memory loss. The patient does not have insomnia.   All other systems reviewed and are negative.    Observations/Objective: Alert and oriented- answers and all questions appropriately No distress  Assessment and Plan: [name] comes in today with chief complaint of No chief complaint on file.   Diagnosis and orders addressed:  1. Mixed hyperlipidemia Low fat diet Continue exercise  2. Anxiety state Stress management  3. Allergy to alpha-gal Continue to avoid red meat Keep epipen close by  4. Screening mammogram, encounter for   Previous labs reviewed Health Maintenance reviewed Diet and exercise encouraged  Follow up plan: 3 months     I discussed the assessment and treatment plan with the patient. The patient was provided an opportunity to ask questions and all were answered. The patient agreed with the plan and demonstrated an understanding of the instructions.   The patient was advised to call back or seek an in-person evaluation if the symptoms worsen or if the condition fails to improve as anticipated.  The above assessment and management plan was discussed with the patient. The patient verbalized understanding of and has agreed to the management plan. Patient is aware to call the clinic if symptoms persist or worsen. Patient is aware when to return to the clinic for a follow-up visit. Patient educated on when it is appropriate to go to the emergency department.   Time call ended:  9:35  I provided 15 minutes of non-face-to-face time during this  encounter.    [name], FNP

## 2019-10-01 ENCOUNTER — Other Ambulatory Visit (HOSPITAL_COMMUNITY): Payer: Self-pay | Admitting: Nurse Practitioner

## 2019-10-01 DIAGNOSIS — Z1231 Encounter for screening mammogram for malignant neoplasm of breast: Secondary | ICD-10-CM

## 2019-10-02 ENCOUNTER — Ambulatory Visit: Payer: Self-pay | Admitting: Nurse Practitioner

## 2019-10-03 ENCOUNTER — Other Ambulatory Visit: Payer: Self-pay

## 2019-10-03 DIAGNOSIS — Z20822 Contact with and (suspected) exposure to covid-19: Secondary | ICD-10-CM

## 2019-10-04 LAB — NOVEL CORONAVIRUS, NAA: SARS-CoV-2, NAA: NOT DETECTED

## 2019-10-22 ENCOUNTER — Ambulatory Visit (INDEPENDENT_AMBULATORY_CARE_PROVIDER_SITE_OTHER): Payer: BC Managed Care – PPO | Admitting: Nurse Practitioner

## 2019-10-22 ENCOUNTER — Encounter: Payer: Self-pay | Admitting: Nurse Practitioner

## 2019-10-22 DIAGNOSIS — R0683 Snoring: Secondary | ICD-10-CM

## 2019-10-22 NOTE — Progress Notes (Signed)
   Virtual Visit via telephone Note Due to COVID-19 pandemic this visit was conducted virtually. This visit type was conducted due to national recommendations for restrictions regarding the COVID-19 Pandemic (e.g. social distancing, sheltering in place) in an effort to limit this patient's exposure and mitigate transmission in our community. All issues noted in this document were discussed and addressed.  A physical exam was not performed with this format.  I connected with [name] on 10/22/19 at 11:45 by telephone and verified that I am speaking with the correct person using two identifiers. [name] is currently located at home and no one is currently with her during visit. The provider, [name], FNP is located in their office at time of visit.  I discussed the limitations, risks, security and privacy concerns of performing an evaluation and management service by telephone and the availability of in person appointments. I also discussed with the patient that there may be a patient responsible charge related to this service. The patient expressed understanding and agreed to proceed.   History and Present Illness:   Chief Complaint: Sleep Apnea   HPI Patient says that her husband says that she has been snoring worse. Says she wakes at night making funny breathing noises. Is very fatigued during the day.    Review of Systems  Constitutional: Negative for diaphoresis and weight loss.  Eyes: Negative for blurred vision, double vision and pain.  Respiratory: Negative for shortness of breath.   Cardiovascular: Negative for chest pain, palpitations, orthopnea and leg swelling.  Gastrointestinal: Negative for abdominal pain.  Skin: Negative for rash.  Neurological: Negative for dizziness, sensory change, loss of consciousness, weakness and headaches.  Endo/Heme/Allergies: Negative for polydipsia. Does not bruise/bleed easily.  Psychiatric/Behavioral: Negative for  memory loss. The patient does not have insomnia.   All other systems reviewed and are negative.    Observations/Objective: Alert and oriented- answers all questions appropriately No distress     Assessment and Plan: [name] in today with chief complaint of Sleep Apnea   1. Snoring - Home sleep test   Follow Up Instructions: prn    I discussed the assessment and treatment plan with the patient. The patient was provided an opportunity to ask questions and all were answered. The patient agreed with the plan and demonstrated an understanding of the instructions.   The patient was advised to call back or seek an in-person evaluation if the symptoms worsen or if the condition fails to improve as anticipated.  The above assessment and management plan was discussed with the patient. The patient verbalized understanding of and has agreed to the management plan. Patient is aware to call the clinic if symptoms persist or worsen. Patient is aware when to return to the clinic for a follow-up visit. Patient educated on when it is appropriate to go to the emergency department.   Time call ended:  11:55 I provided 10 minutes of non-face-to-face time during this encounter.    [name], FNP

## 2019-10-24 ENCOUNTER — Ambulatory Visit (HOSPITAL_COMMUNITY)
Admission: RE | Admit: 2019-10-24 | Discharge: 2019-10-24 | Disposition: A | Discharge status: Home / Self Care | Payer: BC Managed Care – PPO | Source: Ambulatory Visit | Attending: Nurse Practitioner | Admitting: Nurse Practitioner

## 2019-10-24 ENCOUNTER — Other Ambulatory Visit: Payer: Self-pay

## 2019-10-24 DIAGNOSIS — Z1231 Encounter for screening mammogram for malignant neoplasm of breast: Secondary | ICD-10-CM | POA: Diagnosis not present

## 2019-10-26 ENCOUNTER — Other Ambulatory Visit: Payer: Self-pay

## 2019-10-26 DIAGNOSIS — Z20822 Contact with and (suspected) exposure to covid-19: Secondary | ICD-10-CM

## 2019-10-27 LAB — NOVEL CORONAVIRUS, NAA: SARS-CoV-2, NAA: NOT DETECTED

## 2019-11-30 ENCOUNTER — Ambulatory Visit: Payer: BC Managed Care – PPO

## 2019-11-30 ENCOUNTER — Other Ambulatory Visit: Payer: Self-pay

## 2020-01-13 ENCOUNTER — Ambulatory Visit: Payer: BC Managed Care – PPO | Attending: Internal Medicine

## 2020-01-13 DIAGNOSIS — Z23 Encounter for immunization: Secondary | ICD-10-CM | POA: Insufficient documentation

## 2020-01-13 NOTE — Progress Notes (Signed)
   Covid-19 Vaccination Clinic  Name:  [name]    MRN: [id number] DOB: [birth year]  01/13/2020  [name] was observed post Covid-19 immunization for 15 minutes without incidence. She was provided with Vaccine Information Sheet and instruction to access the V-Safe system.   [name] was instructed to call 911 with any severe reactions post vaccine: Marland Kitchen Difficulty breathing  . Swelling of your face and throat  . A fast heartbeat  . A bad rash all over your body  . Dizziness and weakness    Immunizations Administered    Name Date Dose VIS Date Route   Moderna COVID-19 Vaccine 01/13/2020 10:01 AM 0.5 mL 10/16/2019 Intramuscular   Manufacturer: Moderna   Lot: [id number]   NDC: 86751-982-42

## 2020-02-16 ENCOUNTER — Ambulatory Visit: Payer: BC Managed Care – PPO | Attending: Internal Medicine

## 2020-02-16 DIAGNOSIS — Z23 Encounter for immunization: Secondary | ICD-10-CM

## 2020-02-16 NOTE — Progress Notes (Signed)
   Covid-19 Vaccination Clinic  Name:  [name]    MRN: [id number] DOB: 11/20/1960  02/16/2020  [name] was observed post Covid-19 immunization for 15 minutes without incident. She was provided with Vaccine Information Sheet and instruction to access the V-Safe system.   [name] was instructed to call 911 with any severe reactions post vaccine: Marland Kitchen Difficulty breathing  . Swelling of face and throat  . A fast heartbeat  . A bad rash all over body  . Dizziness and weakness   Immunizations Administered    Name Date Dose VIS Date Route   Moderna COVID-19 Vaccine 02/16/2020  9:43 AM 0.5 mL 10/16/2019 Intramuscular   Manufacturer: Moderna   Lot: [id number]   NDC: 12527-129-29

## 2020-02-22 ENCOUNTER — Ambulatory Visit (INDEPENDENT_AMBULATORY_CARE_PROVIDER_SITE_OTHER): Payer: BC Managed Care – PPO

## 2020-02-22 ENCOUNTER — Telehealth: Payer: Self-pay | Admitting: *Deleted

## 2020-02-22 ENCOUNTER — Ambulatory Visit (INDEPENDENT_AMBULATORY_CARE_PROVIDER_SITE_OTHER): Payer: BC Managed Care – PPO | Admitting: Nurse Practitioner

## 2020-02-22 ENCOUNTER — Encounter: Payer: Self-pay | Admitting: Nurse Practitioner

## 2020-02-22 ENCOUNTER — Other Ambulatory Visit: Payer: Self-pay

## 2020-02-22 VITALS — BP 112/71 | HR 68 | Temp 98.2°F | Resp 20 | Ht 62.0 in | Wt 149.0 lb

## 2020-02-22 DIAGNOSIS — Z0000 Encounter for general adult medical examination without abnormal findings: Secondary | ICD-10-CM

## 2020-02-22 DIAGNOSIS — Z0001 Encounter for general adult medical examination with abnormal findings: Secondary | ICD-10-CM

## 2020-02-22 DIAGNOSIS — N951 Menopausal and female climacteric states: Secondary | ICD-10-CM

## 2020-02-22 DIAGNOSIS — Z91018 Allergy to other foods: Secondary | ICD-10-CM

## 2020-02-22 DIAGNOSIS — Z1212 Encounter for screening for malignant neoplasm of rectum: Secondary | ICD-10-CM

## 2020-02-22 DIAGNOSIS — E782 Mixed hyperlipidemia: Secondary | ICD-10-CM | POA: Diagnosis not present

## 2020-02-22 DIAGNOSIS — Z1211 Encounter for screening for malignant neoplasm of colon: Secondary | ICD-10-CM

## 2020-02-22 DIAGNOSIS — F411 Generalized anxiety disorder: Secondary | ICD-10-CM

## 2020-02-22 DIAGNOSIS — Z23 Encounter for immunization: Secondary | ICD-10-CM | POA: Diagnosis not present

## 2020-02-22 DIAGNOSIS — E039 Hypothyroidism, unspecified: Secondary | ICD-10-CM

## 2020-02-22 DIAGNOSIS — R1032 Left lower quadrant pain: Secondary | ICD-10-CM

## 2020-02-22 LAB — URINALYSIS, COMPLETE
Bilirubin, UA: NEGATIVE
Glucose, UA: NEGATIVE
Ketones, UA: NEGATIVE
Leukocytes,UA: NEGATIVE
Nitrite, UA: NEGATIVE
Protein,UA: NEGATIVE
RBC, UA: NEGATIVE
Specific Gravity, UA: 1.02 (ref 1.005–1.030)
Urobilinogen, Ur: 0.2 mg/dL (ref 0.2–1.0)
pH, UA: 7.5 (ref 5.0–7.5)

## 2020-02-22 LAB — MICROSCOPIC EXAMINATION
Bacteria, UA: NONE SEEN
Epithelial Cells (non renal): >10 /hpf — AB (ref 0–10)
RBC, Urine: NONE SEEN /hpf (ref 0–2)
Renal Epithel, UA: >10 /hpf — AB

## 2020-02-22 MED ORDER — THYROID 65 MG PO TABS: 65.0000 mg | ORAL_TABLET | Freq: Every day | ORAL | 1 refills | Status: DC

## 2020-02-22 MED ORDER — ESTRADIOL 0.075 MG/24HR TD PTTW: 1.0000 | MEDICATED_PATCH | TRANSDERMAL | 3 refills | Status: DC

## 2020-02-22 MED ORDER — FLUOXETINE HCL 20 MG PO CAPS: 20.0000 mg | ORAL_CAPSULE | Freq: Two times a day (BID) | ORAL | 1 refills | Status: DC

## 2020-02-22 NOTE — Progress Notes (Signed)
Subjective:    Patient ID: [name], female    DOB: [birth year], 60 y.o.   MRN: [id number]   Chief Complaint: Annual Exam    HPI:  1. Anxiety state Some anxiety but is manageable. Believes the prozac is helping. GAD 7 : Generalized Anxiety Score 02/22/2020  Nervous, Anxious, on Edge 3  Control/stop worrying 1  Worry too much - different things 1  Trouble relaxing 3  Restless 3  Easily annoyed or irritable 3  Afraid - awful might happen 0  Total GAD 7 Score 14  Anxiety Difficulty Not difficult at all    2. Mixed hyperlipidemia Is on modified keto diet so is eating high fat. Not doing much exercise but is walking every day. Lab Results  Component Value Date   CHOL 225 (H) 12/26/2018   HDL 77 12/26/2018   LDLCALC 134 (H) 12/26/2018   TRIG 69 12/26/2018   CHOLHDL 2.9 12/26/2018     3. Allergy to alpha-gal Has had 2 reactions in the last 3 months but was able to medicate early so they did not progress to anaphylaxis. These were due to cross contamination.  4. Hypothyroidism No problems with thyroid at this time. Lab Results  Component Value Date   TSH 0.560 12/26/2018      Outpatient Encounter Medications as of 02/22/2020  Medication Sig  . cholecalciferol (VITAMIN D) 400 units TABS tablet Take 400 Units by mouth.  . Cyanocobalamin (B-12 PO) Take by mouth.  . EPINEPHrine 0.15 MG/0.15ML IJ injection Inject 0.15 mLs (0.15 mg total) into the muscle as needed for anaphylaxis.  Marland Kitchen estradiol (VIVELLE-DOT) 0.075 MG/24HR Place 1 patch onto the skin 2 (two) times a week.   Marland Kitchen FLUoxetine (PROZAC) 20 MG capsule Take 1 capsule (20 mg total) by mouth 2 (two) times daily.  . Magnesium 400 MG CAPS Take 1 capsule by mouth daily.   . Multiple Vitamin (MULTIVITAMIN) tablet Take 1 tablet by mouth daily.  . nitroGLYCERIN (NITROSTAT) 0.4 MG SL tablet Place 1 tablet (0.4 mg total) under the tongue every 5 (five) minutes as needed for chest pain.  . Nutritional Supplements (DHEA PO)  Take 1 tablet by mouth daily.   . progesterone (PROMETRIUM) 100 MG capsule Take 100 mg by mouth daily.  Marland Kitchen thyroid (ARMOUR) 65 MG tablet Take 1 tablet (65 mg total) by mouth daily.  Marland Kitchen VITAMIN A PO Take 1 tablet by mouth daily.   Marland Kitchen VITAMIN D PO Take by mouth.  Marland Kitchen VITAMIN K PO Take 1 tablet by mouth daily.   Marland Kitchen ZINC GLUCONATE PO Take 1 tablet by mouth daily.   . TESTOSTERONE COMPOUNDING KIT 20 % CREA Place onto the skin. nightly    Past Surgical History:  Procedure Laterality Date  . ABDOMINAL HYSTERECTOMY    . CATARACT EXTRACTION W/PHACO Left 12/24/2013   Procedure: CATARACT EXTRACTION PHACO AND INTRAOCULAR LENS PLACEMENT (IOC);  Surgeon: Tonny Branch, MD;  Location: [facility];  Service: Ophthalmology;  Laterality: Left;  CDE:  10.84  . CATARACT EXTRACTION W/PHACO Right 01/07/2014   Procedure: CATARACT EXTRACTION PHACO AND INTRAOCULAR LENS PLACEMENT (IOC);  Surgeon: Tonny Branch, MD;  Location: [facility];  Service: Ophthalmology;  Laterality: Right;  CDE:7.76  . TUBAL LIGATION      Family History  Problem Relation Age of Onset  . Graves' disease Mother   . CAD Father     New complaints: Is having pain in the LLQ at night that radiates around to the back but resolves  during the day. She thinks it is on the same side as the ovary that she still has.  Social history: Husband passed away but has since remarried.   Controlled substance contract: N/A     Review of Systems  Constitutional: Negative.   HENT: Negative.   Respiratory: Negative.   Cardiovascular: Negative.   Gastrointestinal: Positive for abdominal pain (left lower quadrant oain).  Genitourinary: Negative.   Neurological: Negative.   Psychiatric/Behavioral: Negative.   All other systems reviewed and are negative.      Objective:   Physical Exam Vitals and nursing note reviewed.  Constitutional:      Appearance: Normal appearance. She is normal weight.  HENT:     Head: Normocephalic.     Right Ear: Tympanic membrane  normal.     Left Ear: Tympanic membrane normal.     Nose: Nose normal.     Mouth/Throat:     Mouth: Mucous membranes are moist.  Eyes:     Pupils: Pupils are equal, round, and reactive to light.  Cardiovascular:     Rate and Rhythm: Normal rate and regular rhythm.     Heart sounds: Normal heart sounds.  Pulmonary:     Breath sounds: Normal breath sounds.  Chest:     Breasts:        Right: Normal.        Left: Normal.  Abdominal:     General: Abdomen is flat.     Tenderness: There is abdominal tenderness (mild tenderness left lower quadrant).  Genitourinary:    General: Normal vulva.     Vagina: No vaginal discharge.     Rectum: Normal.     Comments: Mild adnexal tenderness left lower quadrant Musculoskeletal:        General: Normal range of motion.     Cervical back: Normal range of motion and neck supple.  Skin:    General: Skin is warm.  Neurological:     General: No focal deficit present.     Mental Status: She is alert and oriented to person, place, and time.    BP 112/71   Pulse 68   Temp 98.2 F (36.8 C) (Temporal)   Resp 20   Ht '5\' 2"'  (1.575 m)   Wt 149 lb (67.6 kg)   SpO2 98%   BMI 27.25 kg/m '  EKG- NSR-Ariel Hassell Done, FNP  chest xray- no acute or chronic cardiopulmonary findings     Assessment & Plan:  [name] comes in today with chief complaint of Annual Exam   Diagnosis and orders addressed:  1. Annual physical exam - Urinalysis, Complete - CBC with Differential/Platelet - IGP,CtNgTv,Apt HPV - EKG 12-Lead - DG Chest 2 View; Future  2. Anxiety state Stress management - FLUoxetine (PROZAC) 20 MG capsule; Take 1 capsule (20 mg total) by mouth 2 (two) times daily.  Dispense: 90 capsule; Refill: 1  3. Mixed hyperlipidemia Low fat diet - CMP14+EGFR - Lipid panel  4. Allergy to alpha-gal Patient is just going to continue to not eat red meat.  Does not want repeat labs Garza  5. Acquired hypothyroidism - Thyroid Panel With  TSH - thyroid (ARMOUR) 65 MG tablet; Take 1 tablet (65 mg total) by mouth daily.  Dispense: 90 tablet; Refill: 1  6. Encounter for screening for colorectal malignant neoplasm - Cologuard  7. Left lower quadrant pain Keep diary of pain - US Pelvic Complete With Transvaginal; Future - US Abdomen Limited; Future  8. Menopausal state -  estradiol (VIVELLE-DOT) 0.075 MG/24HR; Place 1 patch onto the skin 2 (two) times a week.  Dispense: 12 patch; Refill: 3   Labs pending Health Maintenance reviewed Diet and exercise encouraged  Follow up plan: 6 months   Ariel Hassell Done, FNP

## 2020-02-22 NOTE — Addendum Note (Signed)
Addended by: Cleda Daub on: 02/22/2020 12:23 PM   Modules accepted: Orders

## 2020-02-22 NOTE — Telephone Encounter (Signed)
Fax from [employer] Thyroid has had a national recall Please advise

## 2020-02-22 NOTE — Addendum Note (Signed)
Addended by: Bennie Pierini on: 02/22/2020 06:17 PM   Modules accepted: Orders

## 2020-02-22 NOTE — Patient Instructions (Signed)
  Cologuard  Your provider has prescribed Cologuard, an easy-to-use, noninvasive test for colon cancer screening, based on the latest advances in stool DNA science.   Here's what will happen next:  1. You may receive a call or email from [facility] to confirm your mailing address and insurance information 2. Your kit will be shipped directly to you 3. You collect your stool sample in the privacy of your own home. Please follow the instructions that come with the kit 4. You return the kit via [facility] shipping or pick-up, in the same box it arrived in 5. You should receive a call with the results once they are available. If you do not receive a call, please contact our office at [phone]  Insurance Coverage  Cologuard is covered by Medicare and most major insurers Cologuard is covered by Medicare and Medicare Advantage with no co-pay or deductible for eligible patients ages 19-85. Nation[employer], more than 94% of Cologuard patients have no out-of-pocket cost for screening. . Based on the [place] should be covered by most private insurers with no co-pay or deductible for eligible patients (ages 13-75; at average risk for colon cancer; without symptoms). Currently, ~74% of Cologuard patients 45-49 have had no out-of-pocket cost for screening.  . Many national and regional payers have begun paying for CRC screening at 45. [employer] continues to work with payers to expand coverage and access for patients ages 38-49.   Only your healthcare insurance provider can confirm how Cologuard will be covered for you. If you have questions about coverage, you can contact your insurance company directly or ask the specialists at [employer] to do that for you. A Customer Support Specialist can be reached at [phone].    Patient Support Screening for colon cancer is very important to your good health, so if you have any questions at all, please call [employer], commercial Customer Support Specialists at [phone]. They are available 24 hours a day, 6 days a week. An instructional video is available to view online at [email or web address]   *Information and graphics obtained from [email or web address]

## 2020-02-22 NOTE — Telephone Encounter (Signed)
Patient aware- will wait on labs before dedicing which dose of levothyroxin to do

## 2020-02-23 LAB — LIPID PANEL
Chol/HDL Ratio: 3.6 ratio (ref 0.0–4.4)
Cholesterol, Total: 229 mg/dL — ABNORMAL HIGH (ref 100–199)
HDL: 63 mg/dL (ref >39)
LDL Chol Calc (NIH): 142 mg/dL — ABNORMAL HIGH (ref 0–99)
Triglycerides: 134 mg/dL (ref 0–149)
VLDL Cholesterol Cal: 24 mg/dL (ref 5–40)

## 2020-02-23 LAB — CMP14+EGFR
ALT: 16 IU/L (ref 0–32)
AST: 22 IU/L (ref 0–40)
Albumin/Globulin Ratio: 1.7 (ref 1.2–2.2)
Albumin: 4 g/dL (ref 3.8–4.9)
Alkaline Phosphatase: 70 IU/L (ref 39–117)
BUN/Creatinine Ratio: 10 (ref 9–23)
BUN: 11 mg/dL (ref 6–24)
Bilirubin Total: 0.8 mg/dL (ref 0.0–1.2)
CO2: 24 mmol/L (ref 20–29)
Calcium: 9.8 mg/dL (ref 8.7–10.2)
Chloride: 103 mmol/L (ref 96–106)
Creatinine, Ser: 1.05 mg/dL — ABNORMAL HIGH (ref 0.57–1.00)
GFR calc Af Amer: 67 mL/min/{1.73_m2} (ref >59)
GFR calc non Af Amer: 58 mL/min/{1.73_m2} — ABNORMAL LOW (ref >59)
Globulin, Total: 2.3 g/dL (ref 1.5–4.5)
Glucose: 77 mg/dL (ref 65–99)
Potassium: 4.2 mmol/L (ref 3.5–5.2)
Sodium: 141 mmol/L (ref 134–144)
Total Protein: 6.3 g/dL (ref 6.0–8.5)

## 2020-02-23 LAB — CBC WITH DIFFERENTIAL/PLATELET
Basophils Absolute: 0.1 10*3/uL (ref 0.0–0.2)
Basos: 1 %
EOS (ABSOLUTE): 0.1 10*3/uL (ref 0.0–0.4)
Eos: 2 %
Hematocrit: 39.8 % (ref 34.0–46.6)
Hemoglobin: 13.7 g/dL (ref 11.1–15.9)
Immature Grans (Abs): 0 10*3/uL (ref 0.0–0.1)
Immature Granulocytes: 0 %
Lymphocytes Absolute: 2.3 10*3/uL (ref 0.7–3.1)
Lymphs: 41 %
MCH: 30.9 pg (ref 26.6–33.0)
MCHC: 34.4 g/dL (ref 31.5–35.7)
MCV: 90 fL (ref 79–97)
Monocytes Absolute: 0.4 10*3/uL (ref 0.1–0.9)
Monocytes: 8 %
Neutrophils Absolute: 2.6 10*3/uL (ref 1.4–7.0)
Neutrophils: 48 %
Platelets: 306 10*3/uL (ref 150–450)
RBC: 4.43 x10E6/uL (ref 3.77–5.28)
RDW: 12.2 % (ref 11.7–15.4)
WBC: 5.5 10*3/uL (ref 3.4–10.8)

## 2020-02-23 LAB — THYROID PANEL WITH TSH
Free Thyroxine Index: 1.4 (ref 1.2–4.9)
T3 Uptake Ratio: 25 % (ref 24–39)
T4, Total: 5.7 ug/dL (ref 4.5–12.0)
TSH: 0.63 u[IU]/mL (ref 0.450–4.500)

## 2020-02-24 MED ORDER — LEVOTHYROXINE SODIUM 50 MCG PO TABS: 50.0000 ug | ORAL_TABLET | Freq: Every day | ORAL | 3 refills | Status: DC

## 2020-02-24 NOTE — Addendum Note (Signed)
Addended by: Bennie Pierini on: 02/24/2020 01:00 PM   Modules accepted: Orders

## 2020-02-25 ENCOUNTER — Other Ambulatory Visit: Payer: Self-pay | Admitting: *Deleted

## 2020-02-25 ENCOUNTER — Telehealth: Payer: Self-pay | Admitting: Nurse Practitioner

## 2020-02-25 ENCOUNTER — Other Ambulatory Visit: Payer: Self-pay | Admitting: Nurse Practitioner

## 2020-02-25 DIAGNOSIS — R102 Pelvic and perineal pain: Secondary | ICD-10-CM

## 2020-02-25 DIAGNOSIS — R1032 Left lower quadrant pain: Secondary | ICD-10-CM

## 2020-02-25 DIAGNOSIS — E039 Hypothyroidism, unspecified: Secondary | ICD-10-CM

## 2020-02-25 NOTE — Telephone Encounter (Signed)
Aware of lab results and future order for thyroid blood work.

## 2020-02-27 LAB — IGP,CTNGTV,APT HPV
Chlamydia, Nuc. Acid Amp: NEGATIVE
Gonococcus, Nuc. Acid Amp: NEGATIVE
HPV Aptima: NEGATIVE
Trich vag by NAA: NEGATIVE

## 2020-02-29 ENCOUNTER — Ambulatory Visit (HOSPITAL_COMMUNITY)
Admission: RE | Admit: 2020-02-29 | Discharge: 2020-02-29 | Disposition: A | Discharge status: Home / Self Care | Payer: BC Managed Care – PPO | Source: Ambulatory Visit | Attending: Nurse Practitioner | Admitting: Nurse Practitioner

## 2020-02-29 ENCOUNTER — Other Ambulatory Visit: Payer: Self-pay

## 2020-02-29 DIAGNOSIS — R1032 Left lower quadrant pain: Secondary | ICD-10-CM | POA: Diagnosis present

## 2020-02-29 DIAGNOSIS — R102 Pelvic and perineal pain: Secondary | ICD-10-CM | POA: Insufficient documentation

## 2020-04-30 LAB — COLOGUARD: COLOGUARD: NEGATIVE

## 2020-05-05 LAB — COLOGUARD: Cologuard: NEGATIVE

## 2020-05-06 ENCOUNTER — Other Ambulatory Visit: Payer: Self-pay | Admitting: Nurse Practitioner

## 2020-05-06 DIAGNOSIS — F411 Generalized anxiety disorder: Secondary | ICD-10-CM

## 2020-08-07 ENCOUNTER — Ambulatory Visit: Payer: BC Managed Care – PPO | Attending: Internal Medicine

## 2020-08-07 DIAGNOSIS — Z23 Encounter for immunization: Secondary | ICD-10-CM

## 2020-08-07 NOTE — Progress Notes (Signed)
   Covid-19 Vaccination Clinic  Name:  [name]    MRN: [id number] DOB: 24-Jul-1961  08/07/2020  [name] was observed post Covid-19 immunization for 30 minutes based on pre-vaccination screening without incident. She was provided with Vaccine Information Sheet and instruction to access the V-Safe system.   [name] was instructed to call 911 with any severe reactions post vaccine: Marland Kitchen Difficulty breathing  . Swelling of face and throat  . A fast heartbeat  . A bad rash all over body  . Dizziness and weakness

## 2020-11-18 ENCOUNTER — Telehealth: Payer: Self-pay

## 2020-11-19 ENCOUNTER — Ambulatory Visit: Payer: BC Managed Care – PPO | Admitting: Family Medicine

## 2020-11-19 ENCOUNTER — Encounter: Payer: Self-pay | Admitting: Family Medicine

## 2020-11-19 VITALS — BP 119/72 | HR 70 | Ht 62.0 in | Wt 152.0 lb

## 2020-11-19 DIAGNOSIS — B029 Zoster without complications: Secondary | ICD-10-CM | POA: Diagnosis not present

## 2020-11-19 MED ORDER — GABAPENTIN 100 MG PO CAPS: 100.0000 mg | ORAL_CAPSULE | Freq: Every day | ORAL | 0 refills | Status: DC

## 2020-11-19 MED ORDER — ACYCLOVIR 800 MG PO TABS: 800.0000 mg | ORAL_TABLET | Freq: Four times a day (QID) | ORAL | 0 refills | Status: DC

## 2020-11-19 NOTE — Progress Notes (Signed)
Vitals: BP 119/72, HR 70, O2 100%, Wt 152 lb, Ht, 5'2"  HPI: Pt is a 39 y female presenting today with a 4 day history of itching and pain on the extensor surface of her left forearm. Pt states that this began out of nowhere and the pain has come and gone. She describes it as a sharp burning pain that radiates deep in her arm and has been keeping her up at night. Pt currently experiencing no pain, states that she had chicken pox when she was 4y and received a shingles vaccine 10 years ago. Pt denies similar symptoms anywhere else on her body, fever, sore throat, neck pain, dizziness or fatigue, chest pain, congestion or difficulty breathing. She states that she has been taking 800mg  acyclovir daily for the past 4 days which she was given for cold sores as well as taking benadryl and use of topical agents (hydrocortisone and lidocaine) which have had no effect on her symptoms. Pt denies recent exposures or anyone around her with similar symptoms. Upon exam, no signs or rash, erythema, or inflammation on her arms, back, or chest.   PMH: cataracts, MI PSH: hysterectomy, tubal ligation FH: Father and mother had shingles  Medications:  acyclovir 800 mg PO qd Epinephrine 0.15 mg IM prn cynaocobalamin po Levothyroxine PO qd  ROS: Constitutional: denies fevers, chills, or night sweats HEENT: denies changes in hearing, vision, congestion Cardiac: denies chest pain Pulmonary: denies difficulty breathing or SOB GI: denies N/V/D or changes in bowel habits GU: denies changes in urinary habits Neuro: denies lightheadedness or dizziness MSK: denies weakness or fatigue Skin: endorses pain and pruritis in the left arm Psych: denies changes in mood  PE:  General assessment: pt appears in overall good health and in no acute distress HEENT: TM are pearly gray, eyes PERRL, throat is normal Skin: no signs of rash or erythema Cardiac: regular rate and rhythm, no m/r/g Pulmonary: CTA bilaterally Neuro:  alert and oriented MSK: good grip strength and passive range of motion. Psych: appropriate mood and affect  Assessment Possible shingles outbreak  Pt is experiencing dermatomal nerve pain in the extensor surface of the left forearm. No rash present but pt has been self medicating with 800mg  acyclovir for 4 days. Pain is unilateral and pt has a history of chicken pox.  Plan Prescribe 7 day regimen acyclovir 800mg  x5 po qd. Pt counseled on hand washing and reducing spread of shingles. Instructed to follow up if symptoms fail to improve after treatment.  Patient seen and examined with , PA student.  Agree with assessment and plan above. Patient had some pain with light touch over the region so it does indicate for possible neuropathic pain and could be shingles without the rash yet or suppressed because she already started acyclovir.  We will continue acyclovir and give her gabapentin that she can use in the evenings to sleep temporarily and she will return if anything changes. , MD [facility] 11/19/2020, 12:54 PM

## 2020-12-04 ENCOUNTER — Other Ambulatory Visit: Payer: Self-pay

## 2020-12-04 ENCOUNTER — Other Ambulatory Visit: Payer: Self-pay | Admitting: Nurse Practitioner

## 2020-12-04 DIAGNOSIS — F411 Generalized anxiety disorder: Secondary | ICD-10-CM

## 2020-12-04 DIAGNOSIS — Z20822 Contact with and (suspected) exposure to covid-19: Secondary | ICD-10-CM

## 2020-12-06 LAB — NOVEL CORONAVIRUS, NAA: SARS-CoV-2, NAA: DETECTED — AB

## 2020-12-06 LAB — SARS-COV-2, NAA 2 DAY TAT

## 2020-12-11 ENCOUNTER — Other Ambulatory Visit: Payer: Self-pay

## 2020-12-11 DIAGNOSIS — Z20822 Contact with and (suspected) exposure to covid-19: Secondary | ICD-10-CM

## 2020-12-12 LAB — NOVEL CORONAVIRUS, NAA: SARS-CoV-2, NAA: NOT DETECTED

## 2020-12-12 LAB — SARS-COV-2, NAA 2 DAY TAT

## 2021-03-02 ENCOUNTER — Telehealth: Payer: Self-pay

## 2021-03-03 NOTE — Telephone Encounter (Signed)
Left message with pt to call and schedule appointment

## 2021-03-16 ENCOUNTER — Other Ambulatory Visit: Payer: Self-pay

## 2021-03-16 ENCOUNTER — Encounter: Payer: Self-pay | Admitting: Nurse Practitioner

## 2021-03-16 ENCOUNTER — Ambulatory Visit: Payer: BC Managed Care – PPO | Admitting: Nurse Practitioner

## 2021-03-16 VITALS — BP 109/73 | HR 67 | Temp 97.4°F | Resp 20 | Ht 62.0 in | Wt 149.0 lb

## 2021-03-16 DIAGNOSIS — Z111 Encounter for screening for respiratory tuberculosis: Secondary | ICD-10-CM | POA: Diagnosis not present

## 2021-03-16 DIAGNOSIS — F411 Generalized anxiety disorder: Secondary | ICD-10-CM

## 2021-03-16 DIAGNOSIS — Z91018 Allergy to other foods: Secondary | ICD-10-CM

## 2021-03-16 DIAGNOSIS — E782 Mixed hyperlipidemia: Secondary | ICD-10-CM

## 2021-03-16 DIAGNOSIS — E039 Hypothyroidism, unspecified: Secondary | ICD-10-CM | POA: Diagnosis not present

## 2021-03-16 DIAGNOSIS — R1032 Left lower quadrant pain: Secondary | ICD-10-CM

## 2021-03-16 MED ORDER — LEVOTHYROXINE SODIUM 50 MCG PO TABS: 50.0000 ug | ORAL_TABLET | Freq: Every day | ORAL | 1 refills | Status: DC

## 2021-03-16 MED ORDER — FLUOXETINE HCL 20 MG PO CAPS: 20.0000 mg | ORAL_CAPSULE | Freq: Two times a day (BID) | ORAL | 1 refills | Status: DC

## 2021-03-16 NOTE — Addendum Note (Signed)
Addended by: Cleda Daub on: 03/16/2021 10:31 AM   Modules accepted: Orders

## 2021-03-16 NOTE — Patient Instructions (Signed)
Exercising to Stay Healthy To become healthy and stay healthy, it is recommended that you do moderate-intensity and vigorous-intensity exercise. You can tell that you are exercising at a moderate intensity if your heart starts beating faster and you start breathing faster but can still hold a conversation. You can tell that you are exercising at a vigorous intensity if you are breathing much harder and faster and cannot hold a conversation while exercising. Exercising regularly is important. It has many health benefits, such as:  Improving overall fitness, flexibility, and endurance.  Increasing bone density.  Helping with weight control.  Decreasing body fat.  Increasing muscle strength.  Reducing stress and tension.  Improving overall health. How often should I exercise? Choose an activity that you enjoy, and set realistic goals. Your health care provider can help you make an activity plan that works for you. Exercise regularly as told by your health care provider. This may include:  Doing strength training two times a week, such as: ? Lifting weights. ? Using resistance bands. ? Push-ups. ? Sit-ups. ? Yoga.  Doing a certain intensity of exercise for a given amount of time. Choose from these options: ? A total of 150 minutes of moderate-intensity exercise every week. ? A total of 75 minutes of vigorous-intensity exercise every week. ? A mix of moderate-intensity and vigorous-intensity exercise every week. Children, pregnant women, people who have not exercised regularly, people who are overweight, and older adults may need to talk with a health care provider about what activities are safe to do. If you have a medical condition, be sure to talk with your health care provider before you start a new exercise program. What are some exercise ideas? Moderate-intensity exercise ideas include:  Walking 1 mile (1.6 km) in about 15  minutes.  Biking.  Hiking.  Golfing.  Dancing.  Water aerobics. Vigorous-intensity exercise ideas include:  Walking 4.5 miles (7.2 km) or more in about 1 hour.  Jogging or running 5 miles (8 km) in about 1 hour.  Biking 10 miles (16.1 km) or more in about 1 hour.  Lap swimming.  Roller-skating or in-line skating.  Cross-country skiing.  Vigorous competitive sports, such as football, basketball, and soccer.  Jumping rope.  Aerobic dancing.   What are some everyday activities that can help me to get exercise?  Yard work, such as: ? Pushing a lawn mower. ? Raking and bagging leaves.  Washing your car.  Pushing a stroller.  Shoveling snow.  Gardening.  Washing windows or floors. How can I be more active in my day-to-day activities?  Use stairs instead of an elevator.  Take a walk during your lunch break.  If you drive, park your car farther away from your work or school.  If you take public transportation, get off one stop early and walk the rest of the way.  Stand up or walk around during all of your indoor phone calls.  Get up, stretch, and walk around every 30 minutes throughout the day.  Enjoy exercise with a friend. Support to continue exercising will help you keep a regular routine of activity. What guidelines can I follow while exercising?  Before you start a new exercise program, talk with your health care provider.  Do not exercise so much that you hurt yourself, feel dizzy, or get very short of breath.  Wear comfortable clothes and wear shoes with good support.  Drink plenty of water while you exercise to prevent dehydration or heat stroke.  Work out until   your breathing and your heartbeat get faster. Where to find more information  [place] Department of Health and Human Services: www.hhs.gov  Centers for Disease Control and Prevention (CDC): www.cdc.gov Summary  Exercising regularly is important. It will improve your overall fitness,  flexibility, and endurance.  Regular exercise also will improve your overall health. It can help you control your weight, reduce stress, and improve your bone density.  Do not exercise so much that you hurt yourself, feel dizzy, or get very short of breath.  Before you start a new exercise program, talk with your health care provider. This information is not intended to replace advice given to you by your health care provider. Make sure you discuss any questions you have with your health care provider. Document Revised: 10/14/2017 Document Reviewed: 09/22/2017 Elsevier Patient Education  2021 Elsevier Inc.  

## 2021-03-16 NOTE — Progress Notes (Signed)
Subjective:    Patient ID: [name], female    DOB: 01-23-1961, 60 y.o.   MRN: [id number]   Chief Complaint: Left lower abdominal pain    HPI:  1. Mixed hyperlipidemia Does try to watch diet. Exercises daily. Lab Results  Component Value Date   CHOL 229 (H) 02/22/2020   HDL 63 02/22/2020   LDLCALC 142 (H) 02/22/2020   TRIG 134 02/22/2020   CHOLHDL 3.6 02/22/2020     2. Acquired hypothyroidism No problems that aware of. Lab Results  Component Value Date   TSH 0.630 02/22/2020     3. Anxiety state Stays stressed , take spaxil dialy that helps  4. Allergy to alpha-gal Still bad she avoids all mammals. Only has flare up with cross contamination.    Outpatient Encounter Medications as of 03/16/2021  Medication Sig  . cholecalciferol (VITAMIN D) 400 units TABS tablet Take 400 Units by mouth.  . Cyanocobalamin (B-12 PO) Take by mouth.  . EPINEPHrine 0.15 MG/0.15ML IJ injection Inject 0.15 mLs (0.15 mg total) into the muscle as needed for anaphylaxis.  Marland Kitchen estradiol (VIVELLE-DOT) 0.075 MG/24HR Place 1 patch onto the skin 2 (two) times a week.  Marland Kitchen FLUoxetine (PROZAC) 20 MG capsule Take 1 capsule (20 mg total) by mouth 2 (two) times daily. (Needs to be seen before next refill)  . levothyroxine (SYNTHROID) 50 MCG tablet Take 1 tablet (50 mcg total) by mouth daily.  . Magnesium 400 MG CAPS Take 1 capsule by mouth daily.   . Multiple Vitamin (MULTIVITAMIN) tablet Take 1 tablet by mouth daily.  . nitroGLYCERIN (NITROSTAT) 0.4 MG SL tablet Place 1 tablet (0.4 mg total) under the tongue every 5 (five) minutes as needed for chest pain.  . Nutritional Supplements (DHEA PO) Take 1 tablet by mouth daily.   Marland Kitchen VITAMIN A PO Take 1 tablet by mouth daily.   Marland Kitchen VITAMIN D PO Take by mouth.  Marland Kitchen VITAMIN K PO Take 1 tablet by mouth daily.   Marland Kitchen ZINC GLUCONATE PO Take 1 tablet by mouth daily.   Marland Kitchen acyclovir (ZOVIRAX) 800 MG tablet Take 1 tablet (800 mg total) by mouth 4 (four) times daily.  (Patient not taking: Reported on 03/16/2021)  . progesterone (PROMETRIUM) 100 MG capsule Take 100 mg by mouth daily. (Patient not taking: Reported on 03/16/2021)  . TESTOSTERONE COMPOUNDING KIT 20 % CREA Place onto the skin. nightly (Patient not taking: Reported on 03/16/2021)  . [DISCONTINUED] gabapentin (NEURONTIN) 100 MG capsule Take 1 capsule (100 mg total) by mouth at bedtime.   No facility-administered encounter medications on file as of 03/16/2021.    Past Surgical History:  Procedure Laterality Date  . ABDOMINAL HYSTERECTOMY    . CATARACT EXTRACTION W/PHACO Left 12/24/2013   Procedure: CATARACT EXTRACTION PHACO AND INTRAOCULAR LENS PLACEMENT (IOC);  Surgeon: Tonny Branch, MD;  Location: [facility];  Service: Ophthalmology;  Laterality: Left;  CDE:  10.84  . CATARACT EXTRACTION W/PHACO Right 01/07/2014   Procedure: CATARACT EXTRACTION PHACO AND INTRAOCULAR LENS PLACEMENT (IOC);  Surgeon: Tonny Branch, MD;  Location: [facility];  Service: Ophthalmology;  Laterality: Right;  CDE:7.76  . TUBAL LIGATION      Family History  Problem Relation Age of Onset  . Graves' disease Mother   . CAD Father     New complaints: Patient is c/o abdominal pain. Left lower quadrant. Wake sup daily with it. She has had [place] and showed nothing. Pain goes away after a few hours in mornings and then  she is fine. She had hysterectomy years ago.   Social history: Lives with husband  Controlled substance contract: n/a    Review of Systems  Constitutional: Negative for diaphoresis.  Eyes: Negative for pain.  Respiratory: Negative for shortness of breath.   Cardiovascular: Negative for chest pain, palpitations and leg swelling.  Gastrointestinal: Negative for abdominal pain.  Endocrine: Negative for polydipsia.  Skin: Negative for rash.  Neurological: Negative for dizziness, weakness and headaches.  Hematological: Does not bruise/bleed easily.  All other systems reviewed and are negative.      Objective:   Physical  Exam Vitals and nursing note reviewed.  Constitutional:      General: She is not in acute distress.    Appearance: Normal appearance. She is well-developed.  HENT:     Head: Normocephalic.     Nose: Nose normal.  Eyes:     Pupils: Pupils are equal, round, and reactive to light.  Neck:     Vascular: No carotid bruit or JVD.  Cardiovascular:     Rate and Rhythm: Normal rate and regular rhythm.     Heart sounds: Normal heart sounds.  Pulmonary:     Effort: Pulmonary effort is normal. No respiratory distress.     Breath sounds: Normal breath sounds. No wheezing or rales.  Chest:     Chest wall: No tenderness.  Abdominal:     General: Bowel sounds are normal. There is no distension or abdominal bruit.     Palpations: Abdomen is soft. There is no hepatomegaly, splenomegaly, mass or pulsatile mass.     Tenderness: There is no abdominal tenderness.  Musculoskeletal:        General: Normal range of motion.     Cervical back: Normal range of motion and neck supple.  Lymphadenopathy:     Cervical: No cervical adenopathy.  Skin:    General: Skin is warm and dry.  Neurological:     Mental Status: She is alert and oriented to person, place, and time.     Deep Tendon Reflexes: Reflexes are normal and symmetric.  Psychiatric:        Behavior: Behavior normal.        Thought Content: Thought content normal.        Judgment: Judgment normal.     BP 109/73   Pulse 67   Temp (!) 97.4 F (36.3 C) (Temporal)   Resp 20   Ht _0  (1.575 m)   Wt 149 lb (67.6 kg)   SpO2 99%   BMI 27.25 kg/m        Assessment & Plan:  [name] comes in today with chief complaint of Left lower abdominal pain   Diagnosis and orders addressed:  1. Mixed hyperlipidemia Low fat diet - CBC with Differential/Platelet - CMP14+EGFR - Lipid panel  2. Acquired hypothyroidism Labs oending - levothyroxine (SYNTHROID) 50 MCG tablet; Take 1 tablet (50 mcg total) by mouth daily.  Dispense: 90  tablet; Refill: 1 - Thyroid Panel With TSH  3. Anxiety state Stress manaegment - FLUoxetine (PROZAC) 20 MG capsule; Take 1 capsule (20 mg total) by mouth 2 (two) times daily. (Needs to be seen before next refill)  Dispense: 180 capsule; Refill: 1  4. Allergy to alpha-gal Continue to avoid red meat and prok  5. Left lower quadrant abdominal pain Patient wants to hold off on further testing. We think it is scar tissue from hysterectomy.   Labs pending Health Maintenance reviewed Diet and exercise encouraged  Follow up plan: 6 months   [name], FNP

## 2021-03-17 LAB — CBC WITH DIFFERENTIAL/PLATELET
Basophils Absolute: 0.1 10*3/uL (ref 0.0–0.2)
Basos: 1 %
EOS (ABSOLUTE): 0.2 10*3/uL (ref 0.0–0.4)
Eos: 3 %
Hematocrit: 42.3 % (ref 34.0–46.6)
Hemoglobin: 14.3 g/dL (ref 11.1–15.9)
Immature Grans (Abs): 0 10*3/uL (ref 0.0–0.1)
Immature Granulocytes: 0 %
Lymphocytes Absolute: 2 10*3/uL (ref 0.7–3.1)
Lymphs: 33 %
MCH: 30.4 pg (ref 26.6–33.0)
MCHC: 33.8 g/dL (ref 31.5–35.7)
MCV: 90 fL (ref 79–97)
Monocytes Absolute: 0.5 10*3/uL (ref 0.1–0.9)
Monocytes: 8 %
Neutrophils Absolute: 3.4 10*3/uL (ref 1.4–7.0)
Neutrophils: 55 %
Platelets: 299 10*3/uL (ref 150–450)
RBC: 4.71 x10E6/uL (ref 3.77–5.28)
RDW: 12.7 % (ref 11.7–15.4)
WBC: 6.1 10*3/uL (ref 3.4–10.8)

## 2021-03-17 LAB — CMP14+EGFR
ALT: 32 IU/L (ref 0–32)
AST: 31 IU/L (ref 0–40)
Albumin/Globulin Ratio: 2.1 (ref 1.2–2.2)
Albumin: 4.5 g/dL (ref 3.8–4.9)
Alkaline Phosphatase: 69 IU/L (ref 44–121)
BUN/Creatinine Ratio: 21 (ref 12–28)
BUN: 17 mg/dL (ref 8–27)
Bilirubin Total: 0.6 mg/dL (ref 0.0–1.2)
CO2: 23 mmol/L (ref 20–29)
Calcium: 9.4 mg/dL (ref 8.7–10.3)
Chloride: 101 mmol/L (ref 96–106)
Creatinine, Ser: 0.81 mg/dL (ref 0.57–1.00)
Globulin, Total: 2.1 g/dL (ref 1.5–4.5)
Glucose: 88 mg/dL (ref 65–99)
Potassium: 4.4 mmol/L (ref 3.5–5.2)
Sodium: 139 mmol/L (ref 134–144)
Total Protein: 6.6 g/dL (ref 6.0–8.5)
eGFR: 83 mL/min/{1.73_m2} (ref >59)

## 2021-03-17 LAB — LIPID PANEL
Chol/HDL Ratio: 3.5 ratio (ref 0.0–4.4)
Cholesterol, Total: 251 mg/dL — ABNORMAL HIGH (ref 100–199)
HDL: 71 mg/dL (ref >39)
LDL Chol Calc (NIH): 158 mg/dL — ABNORMAL HIGH (ref 0–99)
Triglycerides: 127 mg/dL (ref 0–149)
VLDL Cholesterol Cal: 22 mg/dL (ref 5–40)

## 2021-03-17 LAB — THYROID PANEL WITH TSH
Free Thyroxine Index: 2.4 (ref 1.2–4.9)
T3 Uptake Ratio: 30 % (ref 24–39)
T4, Total: 7.9 ug/dL (ref 4.5–12.0)
TSH: 0.882 u[IU]/mL (ref 0.450–4.500)

## 2021-03-18 ENCOUNTER — Other Ambulatory Visit: Payer: Self-pay

## 2021-03-18 ENCOUNTER — Ambulatory Visit: Payer: BC Managed Care – PPO

## 2021-03-18 DIAGNOSIS — Z111 Encounter for screening for respiratory tuberculosis: Secondary | ICD-10-CM

## 2021-03-18 LAB — TB SKIN TEST
Induration: 0 mm
TB Skin Test: NEGATIVE

## 2021-03-18 NOTE — Progress Notes (Signed)
Patient here to have TB skin test placed on 03/16/21 read.  Results are negative.

## 2021-04-27 ENCOUNTER — Other Ambulatory Visit (HOSPITAL_COMMUNITY): Payer: Self-pay | Admitting: Nurse Practitioner

## 2021-04-27 DIAGNOSIS — Z1231 Encounter for screening mammogram for malignant neoplasm of breast: Secondary | ICD-10-CM

## 2021-04-29 ENCOUNTER — Ambulatory Visit (HOSPITAL_COMMUNITY)
Admission: RE | Admit: 2021-04-29 | Discharge: 2021-04-29 | Disposition: A | Discharge status: Home / Self Care | Payer: BC Managed Care – PPO | Source: Ambulatory Visit | Attending: Nurse Practitioner | Admitting: Nurse Practitioner

## 2021-04-29 DIAGNOSIS — Z1231 Encounter for screening mammogram for malignant neoplasm of breast: Secondary | ICD-10-CM | POA: Diagnosis not present

## 2021-07-20 DIAGNOSIS — F3130 Bipolar disorder, current episode depressed, mild or moderate severity, unspecified: Secondary | ICD-10-CM | POA: Diagnosis not present

## 2021-08-29 ENCOUNTER — Inpatient Hospital Stay: Admission: RE | Admit: 2021-08-29 | Payer: Self-pay | Source: Ambulatory Visit

## 2021-09-02 ENCOUNTER — Telehealth: Payer: Self-pay | Admitting: Nurse Practitioner

## 2021-09-02 NOTE — Telephone Encounter (Signed)
Spoke with Vickie at [facility] to schedule appointment.  She states that patient has told her that she wants to call us herself and schedule the appointment.

## 2021-09-03 ENCOUNTER — Telehealth: Payer: Self-pay | Admitting: Nurse Practitioner

## 2021-09-04 ENCOUNTER — Ambulatory Visit (INDEPENDENT_AMBULATORY_CARE_PROVIDER_SITE_OTHER): Payer: BC Managed Care – PPO | Admitting: Nurse Practitioner

## 2021-09-04 ENCOUNTER — Other Ambulatory Visit: Payer: Self-pay

## 2021-09-04 ENCOUNTER — Encounter: Payer: Self-pay | Admitting: Nurse Practitioner

## 2021-09-04 VITALS — BP 118/84 | HR 73 | Temp 97.7°F | Resp 20 | Ht 62.0 in | Wt 154.0 lb

## 2021-09-04 DIAGNOSIS — N951 Menopausal and female climacteric states: Secondary | ICD-10-CM | POA: Diagnosis not present

## 2021-09-04 DIAGNOSIS — Z09 Encounter for follow-up examination after completed treatment for conditions other than malignant neoplasm: Secondary | ICD-10-CM | POA: Diagnosis not present

## 2021-09-04 DIAGNOSIS — Z91018 Allergy to other foods: Secondary | ICD-10-CM | POA: Diagnosis not present

## 2021-09-04 DIAGNOSIS — T50905A Adverse effect of unspecified drugs, medicaments and biological substances, initial encounter: Secondary | ICD-10-CM | POA: Diagnosis not present

## 2021-09-04 MED ORDER — ESTRADIOL 0.075 MG/24HR TD PTTW: 1.0000 | MEDICATED_PATCH | TRANSDERMAL | 3 refills | Status: DC

## 2021-09-04 MED ORDER — EPINEPHRINE 0.15 MG/0.15ML IJ SOAJ: 0.1500 mg | INTRAMUSCULAR | 3 refills | Status: DC | PRN

## 2021-09-04 NOTE — Progress Notes (Signed)
Subjective:    Patient ID: [name], female    DOB: December 09, 1960, 60 y.o.   MRN: [id number]   Chief Complaint: Hospitalization Follow-up   HPI Patient went to urgent care on 08/29/21 with covid/flu like symptoms. She tested negative for flu and strep. Sh w as given a zpak. The following Monday 08/31/21 she had a syncopial episode and [facility]. They are telling her that the zithromycin caused a prolonged QT and caused syncopial episode. When she passed out she hit her head on cement floor so they kept her in the hospital for several days. After being discharged home , she had an alpha gal reaction and almost had to go back to the hospital. She would like to see a nutritionist .    Review of Systems  Constitutional:  Negative for diaphoresis.  HENT:  Positive for rhinorrhea. Negative for sinus pressure and sore throat.   Eyes:  Negative for pain.  Respiratory:  Negative for shortness of breath.   Cardiovascular:  Negative for chest pain, palpitations and leg swelling.  Gastrointestinal:  Negative for abdominal pain.  Endocrine: Negative for polydipsia.  Skin:  Negative for rash.  Neurological:  Negative for dizziness, weakness and headaches.  Hematological:  Does not bruise/bleed easily.  All other systems reviewed and are negative.     Objective:   Physical Exam Vitals and nursing note reviewed.  Constitutional:      General: She is not in acute distress.    Appearance: Normal appearance. She is well-developed.  HENT:     Head: Normocephalic.     Right Ear: Tympanic membrane normal.     Left Ear: Tympanic membrane normal.     Nose: Nose normal.     Mouth/Throat:     Mouth: Mucous membranes are moist.  Eyes:     Pupils: Pupils are equal, round, and reactive to light.  Neck:     Vascular: No carotid bruit or JVD.  Cardiovascular:     Rate and Rhythm: Normal rate and regular rhythm.     Heart sounds: Normal heart sounds.  Pulmonary:     Effort:  Pulmonary effort is normal. No respiratory distress.     Breath sounds: Normal breath sounds. No wheezing or rales.  Chest:     Chest wall: No tenderness.  Abdominal:     General: Bowel sounds are normal. There is no distension or abdominal bruit.     Palpations: Abdomen is soft. There is no hepatomegaly, splenomegaly, mass or pulsatile mass.     Tenderness: There is no abdominal tenderness.  Musculoskeletal:        General: Normal range of motion.     Cervical back: Normal range of motion and neck supple.  Lymphadenopathy:     Cervical: No cervical adenopathy.  Skin:    General: Skin is warm and dry.  Neurological:     General: No focal deficit present.     Mental Status: She is alert and oriented to person, place, and time.     Cranial Nerves: No cranial nerve deficit.     Sensory: No sensory deficit.     Deep Tendon Reflexes: Reflexes are normal and symmetric.  Psychiatric:        Behavior: Behavior normal.        Thought Content: Thought content normal.        Judgment: Judgment normal.    BP 118/84   Pulse 73   Temp 97.7 F (36.5 C) (  Temporal)   Resp 20   Ht 5\' 2"  (1.575 m)   Wt 154 lb (69.9 kg)   SpO2 100%   BMI 28.17 kg/m        Assessment & Plan:   [name] in today with chief complaint of Hospitalization Follow-up   1. Allergy to alpha-gal Avoid dairy products as well meats - Ambulatory referral to Nutrition and Diabetic Education  2. Adverse effect of drug, initial encounter No more zithromax and zofran- added both to allergy list  3. Hospital discharge follow-up Hospital records reviewed    The above assessment and management plan was discussed with the patient. The patient verbalized understanding of and has agreed to the management plan. Patient is aware to call the clinic if symptoms persist or worsen. Patient is aware when to return to the clinic for a follow-up visit. Patient educated on when it is appropriate to go to the emergency  department.   [name] Gypsy Lore, FNP

## 2021-09-11 DIAGNOSIS — Z713 Dietary counseling and surveillance: Secondary | ICD-10-CM | POA: Diagnosis not present

## 2021-09-12 ENCOUNTER — Encounter (HOSPITAL_COMMUNITY): Payer: Self-pay | Admitting: *Deleted

## 2021-09-12 ENCOUNTER — Emergency Department (HOSPITAL_COMMUNITY)
Admission: EM | Admit: 2021-09-12 | Discharge: 2021-09-12 | Disposition: A | Discharge status: Home / Self Care | Payer: BC Managed Care – PPO | Attending: Emergency Medicine | Admitting: Emergency Medicine

## 2021-09-12 ENCOUNTER — Emergency Department (HOSPITAL_COMMUNITY): Payer: BC Managed Care – PPO

## 2021-09-12 DIAGNOSIS — S99921A Unspecified injury of right foot, initial encounter: Secondary | ICD-10-CM | POA: Diagnosis not present

## 2021-09-12 DIAGNOSIS — M79671 Pain in right foot: Secondary | ICD-10-CM | POA: Diagnosis not present

## 2021-09-12 DIAGNOSIS — Z79899 Other long term (current) drug therapy: Secondary | ICD-10-CM | POA: Insufficient documentation

## 2021-09-12 DIAGNOSIS — W208XXA Other cause of strike by thrown, projected or falling object, initial encounter: Secondary | ICD-10-CM | POA: Insufficient documentation

## 2021-09-12 DIAGNOSIS — S9031XA Contusion of right foot, initial encounter: Secondary | ICD-10-CM | POA: Insufficient documentation

## 2021-09-12 DIAGNOSIS — M7989 Other specified soft tissue disorders: Secondary | ICD-10-CM | POA: Diagnosis not present

## 2021-09-12 MED ORDER — HYDROCODONE-ACETAMINOPHEN 5-325 MG PO TABS: ORAL_TABLET | ORAL | 0 refills | Status: DC

## 2021-09-12 NOTE — ED Triage Notes (Signed)
Right foot injury, dropped a sewing machine on foot

## 2021-09-12 NOTE — ED Provider Notes (Signed)
[facility] EMERGENCY DEPARTMENT Provider Note   CSN: [id number] Arrival date & time: 09/12/21  1730     History Chief Complaint  Patient presents with   Foot Pain    [name] is a 60 y.o. female.  Patient dropped a sewing machine on her right foot.  Patient complains of pain and swelling to top of foot  The history is provided by the patient and medical records. No language interpreter was used.  Foot Pain This is a new problem. The current episode started 6 to 12 hours ago. The problem occurs constantly. The problem has not changed since onset.Pertinent negatives include no chest pain, no abdominal pain and no headaches. Nothing aggravates the symptoms. Nothing relieves the symptoms. She has tried nothing for the symptoms.      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   Hyperlipidemia 07/29/2016   Anxiety state 01/15/2011    Past Surgical History:  Procedure Laterality Date   ABDOMINAL HYSTERECTOMY     CATARACT EXTRACTION W/PHACO Left 12/24/2013   Procedure: CATARACT EXTRACTION PHACO AND INTRAOCULAR LENS PLACEMENT ([place]);  Surgeon: Tonny Branch, MD;  Location: [facility];  Service: Ophthalmology;  Laterality: Left;  CDE:  10.84   CATARACT EXTRACTION W/PHACO Right 01/07/2014   Procedure: CATARACT EXTRACTION PHACO AND INTRAOCULAR LENS PLACEMENT (IOC);  Surgeon: Tonny Branch, MD;  Location: [facility];  Service: Ophthalmology;  Laterality: Right;  CDE:7.76   TUBAL LIGATION       OB History   No obstetric history on file.     Family History  Problem Relation Age of Onset   Graves' disease Mother    CAD Father     Social History   Tobacco Use   Smoking status: Never    Smokeless tobacco: Never  Vaping Use   Vaping Use: Never used  Substance Use Topics   Alcohol use: Yes    Comment: wine occasionally   Drug use: No    Home Medications Prior to Admission medications   Medication Sig Start Date End Date Taking? Authorizing Provider  HYDROcodone-acetaminophen (NORCO/VICODIN) 5-325 MG tablet Take 1 every 6 hours as needed for pain that not relieved by Tylenol alone 09/12/21  Yes Milton Ferguson, MD  acyclovir (ZOVIRAX) 800 MG tablet Take 1 tablet (800 mg total) by mouth 4 (four) times daily. 11/19/20   Dettinger, Fransisca Kaufmann, MD  cholecalciferol (VITAMIN D) 400 units TABS tablet Take 400 Units by mouth. Patient not taking: Reported on 09/04/2021    [provider]  Cyanocobalamin (B-12 PO) Take by mouth. Patient not taking: Reported on 09/04/2021    [provider]  EPINEPHrine 0.15 MG/0.15ML IJ injection Inject 0.15 mg into the muscle as needed for anaphylaxis. 09/04/21   Hassell Done, [name], FNP  estradiol (VIVELLE-DOT) 0.075 MG/24HR Place 1 patch onto the skin 2 (two) times a week. 09/07/21   Hassell Done, [name], FNP  FLUoxetine (PROZAC) 20 MG capsule Take 1 capsule (20 mg total) by mouth 2 (two) times daily. (Needs to be seen before next refill) 03/16/21   Chevis Pretty, FNP  levothyroxine (SYNTHROID) 50 MCG tablet Take 1 tablet (50 mcg total) by mouth  daily. 03/16/21   Chevis Pretty, FNP  Magnesium 400 MG CAPS Take 1 capsule by mouth daily.  Patient not taking: Reported on 09/04/2021    [provider]  Multiple Vitamin (MULTIVITAMIN) tablet Take 1 tablet by mouth daily. Patient not taking: Reported on 09/04/2021    [provider]  nitroGLYCERIN (NITROSTAT) 0.4 MG SL tablet Place 1 tablet (0.4 mg total) under the tongue every 5 (five) minutes as needed for chest pain. 07/29/16   Lavina Hamman, MD  Nutritional Supplements (DHEA PO) Take 1 tablet by mouth daily.  Patient not taking: Reported on 09/04/2021     [provider]  progesterone (PROMETRIUM) 100 MG capsule Take 100 mg by mouth daily.    [provider]  TESTOSTERONE COMPOUNDING KIT 20 % CREA Place onto the skin. nightly    [provider]  VITAMIN A PO Take 1 tablet by mouth daily.  Patient not taking: Reported on 09/04/2021    [provider]  VITAMIN D PO Take by mouth. Patient not taking: Reported on 09/04/2021    [provider]  VITAMIN K PO Take 1 tablet by mouth daily.  Patient not taking: Reported on 09/04/2021    [provider]  ZINC GLUCONATE PO Take 1 tablet by mouth daily.     [provider]    Allergies    Cheese, Mango flavor, Meat extract, Meperidine hcl, Zithromax [azithromycin], and Zofran [ondansetron]  Review of Systems   Review of Systems  Constitutional:  Negative for appetite change and fatigue.  HENT:  Negative for congestion, ear discharge and sinus pressure.   Eyes:  Negative for discharge.  Respiratory:  Negative for cough.   Cardiovascular:  Negative for chest pain.  Gastrointestinal:  Negative for abdominal pain and diarrhea.  Genitourinary:  Negative for frequency and hematuria.  Musculoskeletal:  Negative for back pain.       Right foot pain.  Skin:  Negative for rash.  Neurological:  Negative for seizures and headaches.  Psychiatric/Behavioral:  Negative for hallucinations.    Physical Exam Updated Vital Signs BP 129/81 (BP Location: Left Arm)   Pulse 84   Temp 97.8 F (36.6 C) (Oral)   Resp 16   SpO2 97%   Physical Exam Vitals and nursing note reviewed.  Constitutional:      Appearance: She is well-developed.  HENT:     Head: Normocephalic.     Nose: Nose normal.  Eyes:     General: No scleral icterus.    Conjunctiva/sclera: Conjunctivae normal.  Neck:     Thyroid: No thyromegaly.  Cardiovascular:     Rate and Rhythm: Normal rate and regular rhythm.     Heart sounds: No murmur heard.   No friction rub. No  gallop.  Pulmonary:     Breath sounds: No stridor. No wheezing or rales.  Chest:     Chest wall: No tenderness.  Abdominal:     General: There is no distension.     Tenderness: There is no abdominal tenderness. There is no rebound.  Musculoskeletal:        General: Normal range of motion.     Cervical back: Neck supple.     Comments: Tenderness and swelling to right foot  Lymphadenopathy:     Cervical: No cervical adenopathy.  Skin:    Findings: No erythema or rash.  Neurological:     Mental Status: She is alert and oriented to person, place, and time.  Motor: No abnormal muscle tone.     Coordination: Coordination normal.  Psychiatric:        Behavior: Behavior normal.    ED Results / Procedures / Treatments   Labs (all labs ordered are listed, but only abnormal results are displayed) Labs Reviewed - No data to display  EKG None  Radiology DG Foot Complete Right  Result Date: 09/12/2021 CLINICAL DATA:  Acute RIGHT foot pain following injury today. Initial encounter. EXAM: RIGHT FOOT COMPLETE - 3+ VIEW COMPARISON:  None. FINDINGS: There is no evidence of acute fracture, subluxation or dislocation. Mild dorsal soft tissue swelling is noted. No other significant abnormalities noted. IMPRESSION: Mild dorsal soft tissue swelling. No acute bony abnormality. Electronically Signed   By: Margarette Canada M.D.   On: 09/12/2021 18:31    Procedures Procedures   Medications Ordered in ED Medications - No data to display  ED Course  I have reviewed the triage vital signs and the nursing notes.  Pertinent labs & imaging results that were available during my care of the patient were reviewed by me and considered in my medical decision making (see chart for details).    MDM Rules/Calculators/A&P                           Contusion and swelling to right foot.  She will use crutches and is given pain medicine will follow up with her doctor or orthopedics Final Clinical  Impression(s) / ED Diagnoses Final diagnoses:  Contusion of right foot, initial encounter    Rx / DC Orders ED Discharge Orders          Ordered    HYDROcodone-acetaminophen (NORCO/VICODIN) 5-325 MG tablet        09/12/21 2022             Milton Ferguson, MD 09/12/21 2257

## 2021-09-12 NOTE — Discharge Instructions (Signed)
Keep leg elevated.  Follow-up with Dr. Dallas Schimke or your family doctor later this week

## 2021-09-12 NOTE — ED Notes (Signed)
Pt given d/c instructions, verbalized understanding. With husband to waiting room via wheelchair.

## 2021-09-18 ENCOUNTER — Telehealth: Payer: Self-pay | Admitting: Nurse Practitioner

## 2021-09-18 NOTE — Telephone Encounter (Signed)
Left message to call back  

## 2021-09-21 ENCOUNTER — Other Ambulatory Visit: Payer: Self-pay

## 2021-09-21 ENCOUNTER — Encounter: Payer: Self-pay | Admitting: Nurse Practitioner

## 2021-09-21 ENCOUNTER — Ambulatory Visit (INDEPENDENT_AMBULATORY_CARE_PROVIDER_SITE_OTHER): Payer: BC Managed Care – PPO | Admitting: Nurse Practitioner

## 2021-09-21 VITALS — BP 115/78 | HR 75 | Temp 97.3°F | Resp 20 | Ht 62.0 in | Wt 153.0 lb

## 2021-09-21 DIAGNOSIS — Z23 Encounter for immunization: Secondary | ICD-10-CM

## 2021-09-21 DIAGNOSIS — Z09 Encounter for follow-up examination after completed treatment for conditions other than malignant neoplasm: Secondary | ICD-10-CM

## 2021-09-21 DIAGNOSIS — S9031XA Contusion of right foot, initial encounter: Secondary | ICD-10-CM

## 2021-09-21 NOTE — Progress Notes (Signed)
   Subjective:    Patient ID: [name], female    DOB: 10-20-1961, 60 y.o.   MRN: [id number]   Chief Complaint: Hospitalization Follow-up   HPI Patient went to the ED on 09/12/21 with right foot pain. She dropped a sewing machine on her foot.Jillyn Hidden in the ED showed no fracture.she is still sore and some pain with walking.    Review of Systems  Constitutional:  Negative for diaphoresis.  Eyes:  Negative for pain.  Respiratory:  Negative for shortness of breath.   Cardiovascular:  Negative for chest pain, palpitations and leg swelling.  Gastrointestinal:  Negative for abdominal pain.  Endocrine: Negative for polydipsia.  Skin:  Negative for rash.  Neurological:  Negative for dizziness, weakness and headaches.  Hematological:  Does not bruise/bleed easily.  All other systems reviewed and are negative.     Objective:   Physical Exam Constitutional:      Appearance: Normal appearance.  Cardiovascular:     Rate and Rhythm: Normal rate and regular rhythm.     Pulses: Normal pulses.     Heart sounds: Normal heart sounds.  Pulmonary:     Breath sounds: Normal breath sounds.  Musculoskeletal:     Comments: Mild tenderness on palpation to dorsal surface of right foot. contusion on dorsal surface of left foot  Skin:    General: Skin is warm.  Neurological:     General: No focal deficit present.     Mental Status: She is alert and oriented to person, place, and time.  Psychiatric:        Mood and Affect: Mood normal.        Behavior: Behavior normal.   BP 115/78   Pulse 75   Temp (!) 97.3 F (36.3 C) (Temporal)   Resp 20   Ht 5\' 2"  (1.575 m)   Wt 153 lb (69.4 kg)   SpO2 98%   BMI 27.98 kg/m         Assessment & Plan:   [name] in today with chief complaint of Hospitalization Follow-up   1. Contusion of right foot, initial encounter Ice bid Rest Wrap when if helps  2. Hospital discharge follow-up Hospital records reviewed    The above  assessment and management plan was discussed with the patient. The patient verbalized understanding of and has agreed to the management plan. Patient is aware to call the clinic if symptoms persist or worsen. Patient is aware when to return to the clinic for a follow-up visit. Patient educated on when it is appropriate to go to the emergency department.   [name] Gypsy Lore, FNP

## 2021-09-21 NOTE — Patient Instructions (Signed)
Contusion A contusion is a deep bruise. This is a result of an injury that causes bleeding under the skin. Symptoms of bruising include pain, swelling, and discolored skin. The skin may turn blue, purple, or yellow. Follow these instructions at home: Managing pain, stiffness, and swelling You may use RICE. This stands for: Resting. Icing. Compression, or putting pressure. Elevating, or raising the injured area. To follow this method, do these actions: Rest the injured area. If told, put ice on the injured area. Put ice in a plastic bag. Place a towel between your skin and the bag. Leave the ice on for 20 minutes, 2-3 times per day. If told, put light pressure (compression) on the injured area using an elastic bandage. Make sure the bandage is not too tight. If the area tingles or becomes numb, remove it and put it back on as told by your doctor. If possible, raise (elevate) the injured area above the level of your heart while you are sitting or lying down.  General instructions Take over-the-counter and prescription medicines only as told by your doctor. Keep all follow-up visits as told by your doctor. This is important. Contact a doctor if: Your symptoms do not get better after several days of treatment. Your symptoms get worse. You have trouble moving the injured area. Get help right away if: You have very bad pain. You have a loss of feeling (numbness) in a hand or foot. Your hand or foot turns pale or cold. Summary A contusion is a deep bruise. This is a result of an injury that causes bleeding under the skin. Symptoms of bruising include pain, swelling, and discolored skin. The skin may turn blue, purple, or yellow. This condition is treated with rest, ice, compression, and elevation. This is also called RICE. You may be given over-the-counter medicines for pain. Contact a doctor if you do not feel better, or you feel worse. Get help right away if you have very bad pain, have  lost feeling in a hand or foot, or the area turns pale or cold. This information is not intended to replace advice given to you by your health care provider. Make sure you discuss any questions you have with your health care provider. Document Revised: 06/23/2018 Document Reviewed: 06/23/2018 Elsevier Patient Education  2022 Elsevier Inc.  

## 2021-09-22 DIAGNOSIS — F3130 Bipolar disorder, current episode depressed, mild or moderate severity, unspecified: Secondary | ICD-10-CM | POA: Diagnosis not present

## 2021-09-24 NOTE — Telephone Encounter (Signed)
Left message to call back  

## 2021-10-06 NOTE — Telephone Encounter (Signed)
Message is two weeks old, patient never returned call, encounter closed.

## 2021-10-22 DIAGNOSIS — F3130 Bipolar disorder, current episode depressed, mild or moderate severity, unspecified: Secondary | ICD-10-CM | POA: Diagnosis not present

## 2021-11-04 DIAGNOSIS — Z713 Dietary counseling and surveillance: Secondary | ICD-10-CM | POA: Diagnosis not present

## 2021-11-06 DIAGNOSIS — F3130 Bipolar disorder, current episode depressed, mild or moderate severity, unspecified: Secondary | ICD-10-CM | POA: Diagnosis not present

## 2021-11-27 ENCOUNTER — Encounter (HOSPITAL_COMMUNITY): Payer: Self-pay

## 2021-11-27 ENCOUNTER — Emergency Department (HOSPITAL_COMMUNITY): Payer: BC Managed Care – PPO

## 2021-11-27 ENCOUNTER — Emergency Department (HOSPITAL_COMMUNITY)
Admission: EM | Admit: 2021-11-27 | Discharge: 2021-11-27 | Disposition: A | Discharge status: Home / Self Care | Payer: BC Managed Care – PPO | Attending: Emergency Medicine | Admitting: Emergency Medicine

## 2021-11-27 DIAGNOSIS — M311 Thrombotic microangiopathy, unspecified: Secondary | ICD-10-CM | POA: Insufficient documentation

## 2021-11-27 DIAGNOSIS — R4182 Altered mental status, unspecified: Secondary | ICD-10-CM | POA: Insufficient documentation

## 2021-11-27 DIAGNOSIS — I6509 Occlusion and stenosis of unspecified vertebral artery: Secondary | ICD-10-CM | POA: Diagnosis not present

## 2021-11-27 DIAGNOSIS — R29818 Other symptoms and signs involving the nervous system: Secondary | ICD-10-CM | POA: Diagnosis not present

## 2021-11-27 DIAGNOSIS — G454 Transient global amnesia: Secondary | ICD-10-CM

## 2021-11-27 DIAGNOSIS — Z20822 Contact with and (suspected) exposure to covid-19: Secondary | ICD-10-CM | POA: Insufficient documentation

## 2021-11-27 DIAGNOSIS — R55 Syncope and collapse: Secondary | ICD-10-CM | POA: Insufficient documentation

## 2021-11-27 LAB — URINALYSIS, ROUTINE W REFLEX MICROSCOPIC
Bilirubin Urine: NEGATIVE
Glucose, UA: NEGATIVE mg/dL
Hgb urine dipstick: NEGATIVE
Ketones, ur: NEGATIVE mg/dL
Nitrite: NEGATIVE
Protein, ur: NEGATIVE mg/dL
Specific Gravity, Urine: 1.02 (ref 1.005–1.030)
pH: 6 (ref 5.0–8.0)

## 2021-11-27 LAB — COMPREHENSIVE METABOLIC PANEL
ALT: 34 U/L (ref 0–44)
AST: 41 U/L (ref 15–41)
Albumin: 4.3 g/dL (ref 3.5–5.0)
Alkaline Phosphatase: 67 U/L (ref 38–126)
Anion gap: 8 (ref 5–15)
BUN: 15 mg/dL (ref 8–23)
CO2: 26 mmol/L (ref 22–32)
Calcium: 9 mg/dL (ref 8.9–10.3)
Chloride: 104 mmol/L (ref 98–111)
Creatinine, Ser: 0.85 mg/dL (ref 0.44–1.00)
GFR, Estimated: >60 mL/min (ref >60)
Glucose, Bld: 94 mg/dL (ref 70–99)
Potassium: 4 mmol/L (ref 3.5–5.1)
Sodium: 138 mmol/L (ref 135–145)
Total Bilirubin: 0.9 mg/dL (ref 0.3–1.2)
Total Protein: 7.2 g/dL (ref 6.5–8.1)

## 2021-11-27 LAB — CBC WITH DIFFERENTIAL/PLATELET
Abs Immature Granulocytes: 0.01 10*3/uL (ref 0.00–0.07)
Basophils Absolute: 0.1 10*3/uL (ref 0.0–0.1)
Basophils Relative: 1 %
Eosinophils Absolute: 0.2 10*3/uL (ref 0.0–0.5)
Eosinophils Relative: 3 %
HCT: 43.2 % (ref 36.0–46.0)
Hemoglobin: 14.4 g/dL (ref 12.0–15.0)
Immature Granulocytes: 0 %
Lymphocytes Relative: 38 %
Lymphs Abs: 2.2 10*3/uL (ref 0.7–4.0)
MCH: 30.6 pg (ref 26.0–34.0)
MCHC: 33.3 g/dL (ref 30.0–36.0)
MCV: 91.7 fL (ref 80.0–100.0)
Monocytes Absolute: 0.5 10*3/uL (ref 0.1–1.0)
Monocytes Relative: 9 %
Neutro Abs: 2.8 10*3/uL (ref 1.7–7.7)
Neutrophils Relative %: 49 %
Platelets: 311 10*3/uL (ref 150–400)
RBC: 4.71 MIL/uL (ref 3.87–5.11)
RDW: 12.7 % (ref 11.5–15.5)
WBC: 5.8 10*3/uL (ref 4.0–10.5)
nRBC: 0 % (ref 0.0–0.2)

## 2021-11-27 LAB — RESP PANEL BY RT-PCR (FLU A&B, COVID) ARPGX2
Influenza A by PCR: NEGATIVE
Influenza B by PCR: NEGATIVE
SARS Coronavirus 2 by RT PCR: NEGATIVE

## 2021-11-27 LAB — PROTIME-INR
INR: 0.9 (ref 0.8–1.2)
Prothrombin Time: 11.6 seconds (ref 11.4–15.2)

## 2021-11-27 LAB — I-STAT CHEM 8, ED
BUN: 15 mg/dL (ref 8–23)
Calcium, Ion: 1.16 mmol/L (ref 1.15–1.40)
Chloride: 104 mmol/L (ref 98–111)
Creatinine, Ser: 0.9 mg/dL (ref 0.44–1.00)
Glucose, Bld: 92 mg/dL (ref 70–99)
HCT: 45 % (ref 36.0–46.0)
Hemoglobin: 15.3 g/dL — ABNORMAL HIGH (ref 12.0–15.0)
Potassium: 3.9 mmol/L (ref 3.5–5.1)
Sodium: 140 mmol/L (ref 135–145)
TCO2: 26 mmol/L (ref 22–32)

## 2021-11-27 LAB — CBG MONITORING, ED: Glucose-Capillary: 76 mg/dL (ref 70–99)

## 2021-11-27 LAB — RAPID URINE DRUG SCREEN, HOSP PERFORMED
Amphetamines: NOT DETECTED
Barbiturates: NOT DETECTED
Benzodiazepines: NOT DETECTED
Cocaine: NOT DETECTED
Opiates: NOT DETECTED
Tetrahydrocannabinol: NOT DETECTED

## 2021-11-27 LAB — URINALYSIS, MICROSCOPIC (REFLEX)

## 2021-11-27 LAB — TROPONIN I (HIGH SENSITIVITY)
Troponin I (High Sensitivity): 3 ng/L (ref <18)
Troponin I (High Sensitivity): 4 ng/L (ref <18)

## 2021-11-27 LAB — AMMONIA: Ammonia: 12 umol/L (ref 9–35)

## 2021-11-27 LAB — ETHANOL: Alcohol, Ethyl (B): <10 mg/dL (ref <10)

## 2021-11-27 LAB — APTT: aPTT: 27 seconds (ref 24–36)

## 2021-11-27 MED ORDER — ACETAMINOPHEN 325 MG PO TABS
650.0000 mg | ORAL_TABLET | Freq: Once | ORAL | Status: AC
  Administered 2021-11-27: 650 mg via ORAL
  Filled 2021-11-27: qty 2

## 2021-11-27 MED ORDER — IOHEXOL 350 MG/ML SOLN
75.0000 mL | Freq: Once | INTRAVENOUS | Status: AC | PRN
  Administered 2021-11-27: 75 mL via INTRAVENOUS

## 2021-11-27 NOTE — Discharge Instructions (Addendum)
Contact a health care provider if you: Have a migraine that does not go away. Experience transient global amnesia repeatedly. Get help right away if you: Have a seizure. 

## 2021-11-27 NOTE — ED Notes (Signed)
Pt ambulate well to restroom with husband. Gait Steady. Assistance required d/t hx of syncope.

## 2021-11-27 NOTE — ED Notes (Signed)
Pt returned from MRI °

## 2021-11-27 NOTE — ED Provider Notes (Signed)
[facility] EMERGENCY DEPARTMENT Provider Note   CSN: [id number] Arrival date & time: 11/27/21  3220     History      Chief Complaint  Patient presents with   Altered Mental Status    [name] is a 61 y.o. female  has a past medical history of Allergy to alpha-gal, Cataract, Epstein Barr virus infection, Heart attack ([place]), Idiopathic anaphylactic reaction, and Lyme disease. Hospitalized in November for syncope, abnormal ekg and prolonged QT and dx with prolonged QT (thought to be due to use of Zofran and (at [facility] in [place], [place]. History is gathered from the husband at bedside as the patient is unable to provide history.  Patient's husband also reports a history of rapid cycling bipolar disorder.  She had psych hospitalization 20 years ago and has been otherwise stable since then.  No new medication changes.  Husband reports that this morning he received a call from his wife.  She told him that she passed out.  He noted that she did not remember anything and could not tell him basic information that she would normally know.  Who immediately brought her to the emergency department.He reports no previous history of seizures.  The patient is unable to remember events this morning including whether or not she walked into the hospital.  Was removed reports that she was able to walk and he did not note any obvious neurologic deficits such as weakness.  Patient denies headache, vision changes or pain in her body.  She denies unilateral weakness.      Home Medications Prior to Admission medications   Medication Sig Start Date End Date Taking? Authorizing Provider  acyclovir (ZOVIRAX) 800 MG tablet Take 1 tablet (800 mg total) by mouth 4 (four) times daily. 11/19/20   Dettinger, Fransisca Kaufmann, MD  cholecalciferol (VITAMIN D) 400 units TABS tablet Take 400 Units by mouth.    [provider]  Cyanocobalamin (B-12 PO) Take by mouth.    [provider]  EPINEPHrine  0.15 MG/0.15ML IJ injection Inject 0.15 mg into the muscle as needed for anaphylaxis. 09/04/21   Hassell Done, [name], FNP  estradiol (VIVELLE-DOT) 0.075 MG/24HR Place 1 patch onto the skin 2 (two) times a week. 09/07/21   Hassell Done, [name], FNP  FLUoxetine (PROZAC) 20 MG capsule Take 1 capsule (20 mg total) by mouth 2 (two) times daily. (Needs to be seen before next refill) 03/16/21   Chevis Pretty, FNP  levothyroxine (SYNTHROID) 50 MCG tablet Take 1 tablet (50 mcg total) by mouth daily. 03/16/21   Hassell Done [name], FNP  Magnesium 400 MG CAPS Take 1 capsule by mouth daily.    [provider]  Multiple Vitamin (MULTIVITAMIN) tablet Take 1 tablet by mouth daily.    [provider]  nitroGLYCERIN (NITROSTAT) 0.4 MG SL tablet Place 1 tablet (0.4 mg total) under the tongue every 5 (five) minutes as needed for chest pain. 07/29/16   Lavina Hamman, MD  Nutritional Supplements (DHEA PO) Take 1 tablet by mouth daily.    [provider]  progesterone (PROMETRIUM) 100 MG capsule Take 100 mg by mouth daily.    [provider]  TESTOSTERONE COMPOUNDING KIT 20 % CREA Place onto the skin. nightly    [provider]  VITAMIN A PO Take 1 tablet by mouth daily.    [provider]  VITAMIN D PO Take by mouth.    [provider]  VITAMIN K PO Take 1 tablet by mouth daily.  [provider]  ZINC GLUCONATE PO Take 1 tablet by mouth daily.     [provider]      Allergies    Cheese, Mango flavor, Meat extract, Meperidine hcl, Zithromax [azithromycin], and Zofran [ondansetron]    Review of Systems   Review of Systems As per HPI Physical Exam Updated Vital Signs BP (!) 158/84    Pulse 85    Resp 18    Ht '5\' 2"'  (1.575 m)    Wt 69 kg    SpO2 100%    BMI 27.82 kg/m  Physical Exam Vitals and nursing note reviewed.  Constitutional:      General: She is not in acute distress.    Appearance: She is well-developed. She  is not diaphoretic.  HENT:     Head: Normocephalic and atraumatic.     Right Ear: External ear normal.     Left Ear: External ear normal.     Nose: Nose normal.     Mouth/Throat:     Mouth: Mucous membranes are moist.  Eyes:     General: No scleral icterus.    Conjunctiva/sclera: Conjunctivae normal.  Cardiovascular:     Rate and Rhythm: Normal rate and regular rhythm.     Heart sounds: Normal heart sounds. No murmur heard.   No friction rub. No gallop.  Pulmonary:     Effort: Pulmonary effort is normal. No respiratory distress.     Breath sounds: Normal breath sounds.  Abdominal:     General: Bowel sounds are normal. There is no distension.     Palpations: Abdomen is soft. There is no mass.     Tenderness: There is no abdominal tenderness. There is no guarding.  Musculoskeletal:     Cervical back: Normal range of motion.  Skin:    General: Skin is warm and dry.  Neurological:     General: No focal deficit present.     Mental Status: She is alert. She is disoriented.     GCS: GCS eye subscore is 4. GCS verbal subscore is 5. GCS motor subscore is 6.     Cranial Nerves: No cranial nerve deficit.     Sensory: No sensory deficit.     Motor: No weakness.     Coordination: Coordination normal.     Gait: Gait normal.     Deep Tendon Reflexes: Reflexes normal.     Comments: Patient is oriented to Self only Speech is clear and goal oriented, follows commands Major Cranial nerves without deficit, no facial droop Normal strength in upper and lower extremities bilaterally including dorsiflexion and plantar flexion, strong and equal grip strength Sensation normal to light and sharp touch Moves extremities without ataxia, coordination intact Normal finger to nose and rapid alternating movements Neg romberg, no pronator drift Normal gait Normal heel-shin   Psychiatric:        Behavior: Behavior normal.        Cognition and Memory: Memory is impaired. She exhibits impaired recent  memory and impaired remote memory.    ED Results / Procedures / Treatments   Labs (all labs ordered are listed, but only abnormal results are displayed) Labs Reviewed  I-STAT CHEM 8, ED - Abnormal; Notable for the following components:      Result Value   Hemoglobin 15.3 (*)    All other components within normal limits  RESP PANEL BY RT-PCR (FLU A&B, COVID) ARPGX2  CBC WITH DIFFERENTIAL/PLATELET  ETHANOL  PROTIME-INR  APTT  AMMONIA  COMPREHENSIVE  METABOLIC PANEL  URINALYSIS, ROUTINE W REFLEX MICROSCOPIC  RAPID URINE DRUG SCREEN, HOSP PERFORMED  CBG MONITORING, ED  TROPONIN I (HIGH SENSITIVITY)    EKG None  Radiology No results found.  Procedures Procedures    Medications Ordered in ED Medications - No data to display  ED Course/ Medical Decision Making/ A&P Clinical Course as of 11/27/21 1920  Fri Nov 27, 2021  1009 I-Stat Chem 8, ED(!) [AH]  1009 Troponin I (High Sensitivity) [AH]  1009 Ammonia: 12 [AH]  1009 Alcohol, Ethyl (B): <10 [AH]  1009 CBC WITH DIFFERENTIAL [AH]  1009 CBG monitoring, ED [AH]  1009 Protime-INR [AH]  1009 Comprehensive metabolic panel Initial labs are unremarkable [AH]  1010 ED EKG I interpreted the EKG which shows sinus rhythm at a rate of 83, QTc 450 ms [AH]  1050 CT HEAD WO CONTRAST I interpreted images of a HEAD CT . No acute findings. Case discussed with Dr. Theda Sers who asks that we obtain MRI without of the BRAIN [AH]  1105 Patient continues to have repetitive questioning and amnesia. Husband reports that when she was admitted in [place] for the last episode of syncope she had some memory issues but not as severe as today. [AH]  1411 Updated pt. Still having both retrograde and anterograde amnesia.  MRI negative- I reviewed and interpreted images independently and agree with radiology. [AH]  1511 Case discussed with Dr. Theda Sers of Neurology- recommends CTA as small percentage of these cases can be due to carotid artery stenosis  [AH]    Clinical Course User Index [AH] Margarita Mail, PA-C                           Medical Decision Making Patient here with altered mental status. The differential diagnosis for AMS is extensive and includes, but is not limited to: drug overdose - opioids, alcohol, sedatives, antipsychotics, drug withdrawal, others; Metabolic: hypoxia, hypoglycemia, hyperglycemia, hypercalcemia, hypernatremia, hyponatremia, uremia, hepatic encephalopathy, hypothyroidism, hyperthyroidism, vitamin B12 or thiamine deficiency, carbon monoxide poisoning, Wilson's disease, Lactic acidosis, DKA/HHOS; Infectious: meningitis, encephalitis, bacteremia/sepsis, urinary tract infection, pneumonia, neurosyphilis; Structural: Space-occupying lesion, (brain tumor, subdural hematoma, hydrocephalus,); Vascular: stroke, subarachnoid hemorrhage, coronary ischemia, hypertensive encephalopathy, CNS vasculitis, thrombotic thrombocytopenic purpura, disseminated intravascular coagulation, hyperviscosity; Psychiatric: Schizophrenia, depression; Other: Seizure, hypothermia, heat stroke, ICU psychosis, dementia -"sundowning."  On exam patient does not appear to have signs or symptoms of acute stroke.  She is experiencing memory issues.  Extensive work-up is without acute abnormalities and after multiple hours in the emergency department patient is beginning to have improvement in her memory.  Patient will be discharged to outpatient setting with close outpatient follow-up with neurology.  I have placed an ambulatory referral to neurology.  Patient also given outpatient follow-up with cardiology for her loss of consciousness  Amount and/or Complexity of Data Reviewed Independent Historian: spouse Labs: ordered. Decision-making details documented in ED Course. Radiology: ordered and independent interpretation performed. Decision-making details documented in ED Course. Discussion of management or test interpretation with external provider(s):  Case discussed with neurology as detailed in the ED course    Final Clinical Impression(s) / ED Diagnoses Final diagnoses:  None    Rx / DC Orders ED Discharge Orders     None         Margarita Mail, PA-C 11/27/21 Willow Ora, MD 11/28/21 [phone]

## 2021-11-27 NOTE — ED Triage Notes (Signed)
Pt from home, pt per husband passed out, husband reports pt has been asking the same questions over and over for the last couple hours, generalized weakness noted, pt confused at times, skin warm and dry

## 2021-12-03 ENCOUNTER — Ambulatory Visit (INDEPENDENT_AMBULATORY_CARE_PROVIDER_SITE_OTHER): Payer: BC Managed Care – PPO | Admitting: Neurology

## 2021-12-03 ENCOUNTER — Encounter: Payer: Self-pay | Admitting: Neurology

## 2021-12-03 ENCOUNTER — Other Ambulatory Visit: Payer: Self-pay

## 2021-12-03 VITALS — BP 119/74 | HR 80 | Ht 62.0 in | Wt 157.5 lb

## 2021-12-03 DIAGNOSIS — R404 Transient alteration of awareness: Secondary | ICD-10-CM

## 2021-12-03 DIAGNOSIS — R410 Disorientation, unspecified: Secondary | ICD-10-CM | POA: Diagnosis not present

## 2021-12-03 MED ORDER — LAMOTRIGINE 100 MG PO TABS: 100.0000 mg | ORAL_TABLET | Freq: Two times a day (BID) | ORAL | 11 refills | Status: DC

## 2021-12-03 MED ORDER — LAMOTRIGINE 25 MG PO TABS: ORAL_TABLET | ORAL | 0 refills | Status: DC

## 2021-12-03 NOTE — Progress Notes (Signed)
Chief Complaint  Patient presents with   New Patient (Initial Visit)    Rm 15. PCP is [name]. Accompanied by husband. NP/internal ED referral for transient global amnesia. Pt would like to know how long issues with memory will take to resolve and if it will occur again.      ASSESSMENT AND PLAN  [name] is a 61 y.o. female  Passing out spells in October, and on November 27, 2021  With prolonged confusion, especially on January 13, confusion lasted for 7 hours  Differentiation diagnosis: Partial seizure, each episode has loss of consciousness, which is not typical for transient global amnesia, need to rule out syncope/cardiac event  Refer to cardiologist, prolonged EKG monitoring  EEG  Had extensive discussion with patient and her husband, decided to proceed to empirically treat with lamotrigine, titrating to 100 mg twice a day  Laboratory evaluation to rule out treatable cause of her memory complaints  DIAGNOSTIC DATA (LABS, IMAGING, TESTING) - I reviewed patient records, labs, notes, testing and imaging myself where available.   MEDICAL HISTORY:  [name] is a 61 year old female, seen in request by primary care PA Margarita Mail, for evaluation of episodes of confusion, she is accompanied by her husband at today's visit, December 03, 2021  I reviewed and summarized the referring note. PMHx. 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, after school hours, she was steady on the doorway sending students home, without warning signs she passed out, went limp on the floor, there was no witnessed seizure activity,  paramedic was called, she was taken by ambulance to a local hospital, loss of consciousness last about 15 to 20 minutes, there was prolonged confusion post events  Second episode was on November 27, 2021, her husband went out for breakfast with his friend, she was still in bed before he left, about 15 minutes later, he received a phone call, confused, crying, reporting that she could not remember anything, but her husband attended her, she was able to interact with her, just confused, no motor deficit noted, he took her to the emergency room, in the standing position with triage nurse, she suddenly collapsed to the floor, went to limp, no seizure activity, lasting for few minutes,  She remains confused, but was able to interact with people from 815 to 3 PM, then she cannot remember her father came to the hospital visiting her  Had extensive evaluation, personally reviewed MRI of the brain without contrast November 27, 2021, no acute abnormality  CT angiogram of head and neck showed no large vessel disease  Laboratory evaluations, negative UDS, ammonia, alcohol level, troponin, CMP, CBC,  EEG was not performed,  She is also concerned about her memory loss, sometimes in the morning 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  PHYSICAL EXAMNIATION:  Gen: NAD, conversant, well nourised, well groomed                     Cardiovascular: Regular rate rhythm, no peripheral edema, warm, nontender. Eyes: Conjunctivae clear without exudates or hemorrhage Neck: Supple, no carotid bruits. Pulmonary: Clear to auscultation bilaterally   NEUROLOGICAL EXAM:  MENTAL STATUS: Speech:    Speech is normal; fluent  and spontaneous with normal comprehension.  Cognition:      Montreal Cognitive Assessment  12/03/2021  Visuospatial/ Executive (0/5) 5  Naming (0/3) 3  Attention: Read list of digits (0/2) 2  Attention: Read list of letters (0/1) 1  Attention: Serial 7 subtraction starting at 100 (0/3) 3  Language: Repeat phrase (0/2) 2  Language : Fluency (0/1) 1  Abstraction (0/2) 2  Delayed Recall (0/5) 4  Orientation (0/6) 6  Total 29      CRANIAL NERVES: CN II: Visual fields are full to confrontation. Pupils are round equal and briskly reactive to light. CN III, IV, VI: extraocular movement are normal. No ptosis. CN V: Facial sensation is intact to light touch CN VII: Face is symmetric with normal eye closure  CN VIII: Hearing is normal to causal conversation. CN IX, X: Phonation is normal. CN XI: Head turning and shoulder shrug are intact  MOTOR: There is no pronator drift of out-stretched arms. Muscle bulk and tone are normal. Muscle strength is normal.  REFLEXES: Reflexes are 2+ and symmetric at the biceps, triceps, knees, and ankles. Plantar responses are flexor.  SENSORY: Intact to light touch, pinprick and vibratory sensation are intact in fingers and toes.  COORDINATION: There is no trunk or limb dysmetria noted.  GAIT/STANCE: Posture is normal. Gait is steady with normal steps, base, arm swing, and turning. Heel and toe walking are normal. Tandem gait is normal.  Romberg is absent.  REVIEW OF SYSTEMS:  Full 14 system review of systems performed and notable only for as above All other review of systems were negative.   ALLERGIES: Allergies  Allergen Reactions   Cheese     Alpha-Gal Syndrome- 12/03/2021 states she no longer has allergy   Mango Flavor Anaphylaxis   Meat Extract     Alpha-Gal Syndrome   Meperidine Hcl Other (See Comments)    SYNCOPE   Zithromax [Azithromycin]    Zofran [Ondansetron] Other (See Comments)    Prolonged QT    HOME MEDICATIONS: Current  Outpatient Medications  Medication Sig Dispense Refill   acyclovir (ZOVIRAX) 800 MG tablet Take 1 tablet (800 mg total) by mouth 4 (four) times daily. 28 tablet 0   EPINEPHrine 0.15 MG/0.15ML IJ injection Inject 0.15 mg into the muscle as needed for anaphylaxis. 2 each 3   estradiol (VIVELLE-DOT) 0.075 MG/24HR Place 1 patch onto the skin 2 (two) times a week. 12 patch 3   FLUoxetine (PROZAC) 20 MG capsule Take 1 capsule (20 mg total) by mouth 2 (two) times daily. (Needs to be seen before next refill) 180 capsule 1   levothyroxine (SYNTHROID) 50 MCG tablet Take 1 tablet (50 mcg total) by mouth daily. 90 tablet 1   nitroGLYCERIN (NITROSTAT) 0.4 MG SL tablet Place 1 tablet (0.4 mg total) under the tongue every 5 (five) minutes as needed for chest pain. 30 tablet 0   Nutritional Supplements (DHEA PO) Take 1 tablet by mouth daily.     TESTOSTERONE COMPOUNDING KIT 20 % CREA Place onto the skin. nightly     No current facility-administered medications for this  visit.    PAST MEDICAL HISTORY: Past Medical History:  Diagnosis Date   Allergy to alpha-gal    Cataract    Epstein Barr virus infection    Heart attack ([place])    Idiopathic anaphylactic reaction    Lyme disease     PAST SURGICAL HISTORY: Past Surgical History:  Procedure Laterality Date   ABDOMINAL HYSTERECTOMY     CATARACT EXTRACTION W/PHACO Left 12/24/2013   Procedure: CATARACT EXTRACTION PHACO AND INTRAOCULAR LENS PLACEMENT (IOC);  Surgeon: Tonny Branch, MD;  Location: [facility];  Service: Ophthalmology;  Laterality: Left;  CDE:  10.84   CATARACT EXTRACTION W/PHACO Right 01/07/2014   Procedure: CATARACT EXTRACTION PHACO AND INTRAOCULAR LENS PLACEMENT (IOC);  Surgeon: Tonny Branch, MD;  Location: [facility];  Service: Ophthalmology;  Laterality: Right;  CDE:7.76   TUBAL LIGATION      FAMILY HISTORY: Family History  Problem Relation Age of Onset   Graves' disease Mother    CAD Father     SOCIAL HISTORY: Social History   Socioeconomic  History   Marital status: Married    Spouse name: Not on file   Number of children: Not on file   Years of education: Not on file   Highest education level: Not on file  Occupational History   Not on file  Tobacco Use   Smoking status: Never   Smokeless tobacco: Never  Vaping Use   Vaping Use: Never used  Substance and Sexual Activity   Alcohol use: Yes    Comment: wine occasionally   Drug use: No   Sexual activity: Not on file  Other Topics Concern   Not on file  Social History Narrative   Not on file   Social Determinants of Health   Financial Resource Strain: Not on file  Food Insecurity: Not on file  Transportation Needs: Not on file  Physical Activity: Not on file  Stress: Not on file  Social Connections: Not on file  Intimate Partner Violence: Not on file      Marcial Pacas, M.D. Ph.D.  [facility] [address] [place], [place] [address] Ph: [phone] Fax: [phone]  CC:  Margarita Mail, PA-C No address on file  Chevis Pretty, [place]

## 2021-12-04 LAB — VITAMIN B12: Vitamin B-12: 969 pg/mL (ref 232–1245)

## 2021-12-04 LAB — C-REACTIVE PROTEIN: CRP: 4 mg/L (ref 0–10)

## 2021-12-04 LAB — RPR: RPR Ser Ql: NONREACTIVE

## 2021-12-04 LAB — ANA W/REFLEX IF POSITIVE: Anti Nuclear Antibody (ANA): NEGATIVE

## 2021-12-04 LAB — SEDIMENTATION RATE: Sed Rate: 2 mm/hr (ref 0–40)

## 2021-12-04 LAB — TSH: TSH: 1.97 u[IU]/mL (ref 0.450–4.500)

## 2021-12-06 NOTE — Progress Notes (Signed)
Cardiology Office Note:    Date:  12/07/2021   ID:  [name], DOB [birth year], MRN [id number]  PCP:  Chevis Pretty, [facility] Cardiologist:  Lenna Sciara, MD Referring MD: Marcial Pacas, MD   Chief Complaint/Reason for Referral: Altered mental status.  ASSESSMENT:    Syncope and collapse - Plan: EKG 12-Lead, LONG TERM MONITOR (3-14 DAYS), ECHOCARDIOGRAM COMPLETE  Precordial pain    PLAN:    In order of problems listed above:  Unclear etiology regarding the patient's drop attacks.  Her EEG is pending.  We will obtain an echocardiogram, 2-week ZIO monitor, and coronary CTA to evaluate further.  We will keep follow-up open-ended depending on the results of this testing.            Dispo:  Return if symptoms worsen or fail to improve.     Medication Adjustments/Labs and Tests Ordered: Current medicines are reviewed at length with the patient today.  Concerns regarding medicines are outlined above.   Tests Ordered: Orders Placed This Encounter  Procedures   LONG TERM MONITOR (3-14 DAYS)   EKG 12-Lead   ECHOCARDIOGRAM COMPLETE    Medication Changes: No orders of the defined types were placed in this encounter.   History of Present Illness:    The patient is a 61 y.o. female with the indicated medical history here for confusion and syncope.  The patient had seen neurology recently.  She had described an episode in October 2022 when she passed out while at work.  She was taken to a local hospital and had prolonged confusion about what happened.  At that time the episode was thought to be due to prolonged QT syndrome as she was on an antibiotic at that time.  She then had another episode in January of this year.  She was in bed and called her husband and was confused and crying.  Her husband took her to the emergency department and while interacting with the triage nurse she collapsed.  No seizure activity was seen with either of these  episodes.  She had extensive evaluation including MRI of the brain and CT angiogram of the head and neck which showed no abnormalities.  An EEG was ordered but has not yet resulted.  She is here for cardiology recommendations.  The patient tells me that she has been under a lot of stress over the last year or so.  She recently changed jobs and was working about an hour away.  Her sleep became very fragmented as well.  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  She was seen by neurology and an EEG was ordered.  Other laboratories were also drawn.  She does have a history of Lyme disease which was treated with doxycycline some years ago.  She has a interesting history of alpha gal syndrome which is severe allergy to animal products.   In terms of symptoms she denies dyspnea or regular exertional angina.  She however does have chest pain on occasion in relation to exertion but this happens rather infrequently. She has occasional palpitations but they are not long-lasting.  She cannot tell me any exacerbating or alleviating factors in regards to them.  She denies any paroxysmal nocturnal dyspnea or orthopnea.  She has had no peripheral edema.  Both her husband and her believe this is likely due to severe exhaustion from her schedule and her caregiver stress.  She has since quit  her job in [place] which required an hours drive and is now at a new job here locally.   Previous Medical History: Past Medical History:  Diagnosis Date   Allergy to alpha-gal    Cataract    Epstein Barr virus infection    Heart attack ([place])    Idiopathic anaphylactic reaction    Lyme disease   Alpha-gal syndrome (severe allergic reaction and angioedema in response to animal products)   Current Medications: Current Meds  Medication Sig   acyclovir (ZOVIRAX) 800 MG tablet Take 1 tablet (800 mg total) by mouth 4 (four) times daily. (Patient taking differently:  Take 800 mg by mouth 4 (four) times daily as needed (as directed).)   EPINEPHrine 0.15 MG/0.15ML IJ injection Inject 0.15 mg into the muscle as needed for anaphylaxis.   estradiol (VIVELLE-DOT) 0.075 MG/24HR Place 1 patch onto the skin 2 (two) times a week.   FLUoxetine (PROZAC) 20 MG capsule Take 1 capsule (20 mg total) by mouth 2 (two) times daily. (Needs to be seen before next refill) (Patient taking differently: Take 20 mg by mouth daily. (Needs to be seen before next refill))   levothyroxine (SYNTHROID) 50 MCG tablet Take 1 tablet (50 mcg total) by mouth daily.   nitroGLYCERIN (NITROSTAT) 0.4 MG SL tablet Place 1 tablet (0.4 mg total) under the tongue every 5 (five) minutes as needed for chest pain.   TESTOSTERONE COMPOUNDING KIT 20 % CREA Place onto the skin. nightly     Allergies:    Cheese, Mango flavor, Meat extract, Meperidine hcl, Zithromax [azithromycin], and Zofran [ondansetron]   Social History:   Social History   Tobacco Use   Smoking status: Never   Smokeless tobacco: Never  Vaping Use   Vaping Use: Never used  Substance Use Topics   Alcohol use: Yes    Comment: wine occasionally   Drug use: No     Family Hx: Family History  Problem Relation Age of Onset   Graves' disease Mother    CAD Father      Review of Systems:   Please see the history of present illness.    All other systems reviewed and are negative.     EKGs/Labs/Other Test Reviewed:    EKG:  EKG today: Sinus rhythm with PACs; prior EKG: Sinus rhythm with PACs  Prior CV studies:  2017 exercise treadmill stress test Equivocal ST segment changes with nonlimiting chest discomfort reported. Patient achieved 7.8 METS. Normal blood pressure response. Overall intermediate Duke treadmill score of -0.5.  2017 Lexiscan No clearly diagnostic ST segment changes over baseline. Mild hypertensive response noted. Low risk Duke treadmill score of 5. No significant myocardial profusion defects to indicate scar  or ischemia. This is a low risk study. Nuclear stress EF: 76%.  2017 echocardiogram - Left ventricle: The cavity size was normal. Wall thickness was    increased in a pattern of mild LVH. Systolic function was normal.    The estimated ejection fraction was in the range of 60% to 65%.    Wall motion was normal; there were no regional wall motion    abnormalities. Left ventricular diastolic function parameters    were normal.  - Aortic valve: Valve area (VTI): 2.48 cm^2. Valve area (Vmax):    2.54 cm^2.  - Atrial septum: No defect or patent foramen ovale was identified.  - Technically adequate study.   Imaging studies that I have independently reviewed today: Echocardiogram, Lexiscan  Recent Labs: 11/27/2021: ALT 34; BUN 15; Creatinine,  Ser 0.90; Hemoglobin 15.3; Platelets 311; Potassium 3.9; Sodium 140 12/03/2021: TSH 1.970   Recent Lipid Panel Lab Results  Component Value Date/Time   CHOL 251 (H) 03/16/2021 10:17 AM   TRIG 127 03/16/2021 10:17 AM   HDL 71 03/16/2021 10:17 AM   LDLCALC 158 (H) 03/16/2021 10:17 AM    Risk Assessment/Calculations:          Physical Exam:    VS:  BP 124/84    Pulse 86    Ht '5\' 2"'  (1.575 m)    Wt 158 lb 6.4 oz (71.8 kg)    SpO2 97%    BMI 28.97 kg/m    Wt Readings from Last 3 Encounters:  12/07/21 158 lb 6.4 oz (71.8 kg)  12/03/21 157 lb 8 oz (71.4 kg)  11/27/21 152 lb 1.9 oz (69 kg)    GENERAL:  No apparent distress, AOx3 HEENT:  No carotid bruits, +2 carotid impulses, no scleral icterus CAR: RRR no murmurs, gallops, rubs, or thrills RES:  Clear to auscultation bilaterally ABD:  Soft, nontender, nondistended, positive bowel sounds x 4 VASC:  +2 radial pulses, +2 carotid pulses, palpable pedal pulses NEURO:  CN 2-12 grossly intact; motor and sensory grossly intact PSYCH:  No active depression or anxiety EXT:  No edema, ecchymosis, or cyanosis  Signed, Early Osmond, MD  12/07/2021 4:02 PM    [facility]  [facility], [place], [place]  [address] Phone: [phone]; Fax: [phone]   Note:  This document was prepared using Dragon voice recognition software and may include unintentional dictation errors.

## 2021-12-07 ENCOUNTER — Other Ambulatory Visit: Payer: Self-pay

## 2021-12-07 ENCOUNTER — Ambulatory Visit (INDEPENDENT_AMBULATORY_CARE_PROVIDER_SITE_OTHER): Payer: BC Managed Care – PPO

## 2021-12-07 ENCOUNTER — Encounter: Payer: Self-pay | Admitting: Internal Medicine

## 2021-12-07 ENCOUNTER — Ambulatory Visit (INDEPENDENT_AMBULATORY_CARE_PROVIDER_SITE_OTHER): Payer: BC Managed Care – PPO | Admitting: Internal Medicine

## 2021-12-07 VITALS — BP 124/84 | HR 86 | Ht 62.0 in | Wt 158.4 lb

## 2021-12-07 DIAGNOSIS — R072 Precordial pain: Secondary | ICD-10-CM | POA: Diagnosis not present

## 2021-12-07 DIAGNOSIS — R55 Syncope and collapse: Secondary | ICD-10-CM

## 2021-12-07 DIAGNOSIS — Z01812 Encounter for preprocedural laboratory examination: Secondary | ICD-10-CM

## 2021-12-07 MED ORDER — METOPROLOL TARTRATE 100 MG PO TABS: 100.0000 mg | ORAL_TABLET | Freq: Once | ORAL | 0 refills | Status: DC

## 2021-12-07 NOTE — Patient Instructions (Signed)
Medication Instructions:  NO CHANGES *If you need a refill on your cardiac medications before your next appointment, please call your pharmacy*   Lab Work: BMET PRIOR TO CORONARY CT If you have labs (blood work) drawn today and your tests are completely normal, you will receive your results only by: [place] (if you have MyChart) OR A paper copy in the mail If you have any lab test that is abnormal or we need to change your treatment, we will call you to review the results.   Testing/Procedures: Your physician has requested that you have an echocardiogram. Echocardiography is a painless test that uses sound waves to create images of your heart. It provides your doctor with information about the size and shape of your heart and how well your hearts chambers and valves are working. This procedure takes approximately one hour. There are no restrictions for this procedure. ZIO XT- Long Term Monitor Instructions  Your physician has requested you wear a ZIO patch monitor for  7 days.  This is a single patch monitor. [employer] supplies one patch monitor per enrollment. Additional stickers are not available. Please do not apply patch if you will be having a Nuclear Stress Test,  Echocardiogram, Cardiac CT, MRI, or Chest Xray during the period you would be wearing the  monitor. The patch cannot be worn during these tests. You cannot remove and re-apply the  ZIO XT patch monitor.  Your ZIO patch monitor will be mailed 3 day USPS to your address on file. It may take 3-5 days  to receive your monitor after you have been enrolled.  Once you have received your monitor, please review the enclosed instructions. Your monitor  has already been registered assigning a specific monitor serial # to you.  Billing and Patient Assistance Program Information  We have supplied [employer] with any of your insurance information on file for billing purposes. [employer] offers a sliding scale Patient Assistance  Program for patients that do not have  insurance, or whose insurance does not completely cover the cost of the ZIO monitor.  You must apply for the Patient Assistance Program to qualify for this discounted rate.  To apply, please call [employer] at [phone], select option 4, select option 2, ask to apply for  Patient Assistance Program. Theodore Demark will ask your household income, and how many people  are in your household. They will quote your out-of-pocket cost based on that information.  [employer] will also be able to set up a 57-month, interest-free payment plan if needed.  Applying the monitor   Shave hair from upper left chest.  Hold abrader disc by orange tab. Rub abrader in 40 strokes over the upper left chest as  indicated in your monitor instructions.  Clean area with 4 enclosed alcohol pads. Let dry.  Apply patch as indicated in monitor instructions. Patch will be placed under collarbone on left  side of chest with arrow pointing upward.  Rub patch adhesive wings for 2 minutes. Remove white label marked "1". Remove the white  label marked "2". Rub patch adhesive wings for 2 additional minutes.  While looking in a mirror, press and release button in center of patch. A small green light will  flash 3-4 times. This will be your only indicator that the monitor has been turned on.  Do not shower for the first 24 hours. You may shower after the first 24 hours.  Press the button if you feel a symptom. You will hear a small click. Record  Date, Time and  Symptom in the Patient Logbook.  When you are ready to remove the patch, follow instructions on the last 2 pages of Patient  Logbook. Stick patch monitor onto the last page of Patient Logbook.  Place Patient Logbook in the blue and white box. Use locking tab on box and tape box closed  securely. The blue and white box has prepaid postage on it. Please place it in the mailbox as  soon as possible. Your physician should have your test results  approximately 7 days after the  monitor has been mailed back to [facility].  Call [place] at [phone] if you have questions regarding  your ZIO XT patch monitor. Call them immediately if you see an orange light blinking on your  monitor.  If your monitor falls off in less than 4 days, contact our Monitor department at [phone].  If your monitor becomes loose or falls off after 4 days call [employer] at [phone] for  suggestions on securing your monitor    Follow-Up: At [facility], you and your health needs are our priority.  As part of our continuing mission to provide you with exceptional heart care, we have created designated Provider Care Teams.  These Care Teams include your primary Cardiologist (physician) and Advanced Practice Providers (APPs -  Physician Assistants and Nurse Practitioners) who all work together to provide you with the care you need, when you need it.  We recommend signing up for the patient portal called "MyChart".  Sign up information is provided on this After Visit Summary.  MyChart is used to connect with patients for Virtual Visits (Telemedicine).  Patients are able to view lab/test results, encounter notes, upcoming appointments, etc.  Non-urgent messages can be sent to your provider as well.   To learn more about what you can do with MyChart, go to [email or web address].    Your next appointment:   AS NEEDED  The format for your next appointment:     :1}    Other Instructions   Your cardiac CT will be scheduled at one of the below locations:   [facility] [address] [place], [place] [address] [phone]   If scheduled at [facility], please arrive at the [facility] main entrance (entrance A) of [facility] 30 minutes prior to test start time. You can use the FREE valet parking offered at the main entrance (encouraged to control the heart rate for the test) Proceed to  the [facility] Radiology Department (first floor) to check-in and test prep.  Please follow these instructions carefully (unless otherwise directed):  On the Night Before the Test: Be sure to Drink plenty of water. Do not consume any caffeinated/decaffeinated beverages or chocolate 12 hours prior to your test. Do not take any antihistamines 12 hours prior to your test.    On the Day of the Test: Drink plenty of water until 1 hour prior to the test. Do not eat any food 4 hours prior to the test. You may take your regular medications prior to the test.  Take metoprolol (Lopressor) two hours prior to test. FEMALES- please wear underwire-free bra if available, avoid dresses & tight clothing   *For Clinical Staff only. Please instruct patient the following:* Heart Rate Medication Recommendations for Cardiac CT  Resting HR < 50 bpm  No medication  Resting HR 50-60 bpm and BP >110/50 mmHG   Consider Metoprolol tartrate 25 mg PO 90-120 min prior to scan  Resting HR 60-65 bpm and BP >110/50 mmHG  Metoprolol tartrate 50 mg PO 90-120 minutes prior to scan   Resting HR > 65 bpm and BP >110/50 mmHG  Metoprolol tartrate 100 mg PO 90-120 minutes prior to scan  Consider Ivabradine 10-15 mg PO or a calcium channel blocker for resting HR >60 bpm and contraindication to metoprolol tartrate  Consider Ivabradine 10-15 mg PO in combination with metoprolol tartrate for HR >80 bpm         After the Test: Drink plenty of water. After receiving IV contrast, you may experience a mild flushed feeling. This is normal. On occasion, you may experience a mild rash up to 24 hours after the test. This is not dangerous. If this occurs, you can take Benadryl 25 mg and increase your fluid intake. If you experience trouble breathing, this can be serious. If it is severe call 911 IMMEDIATELY. If it is mild, please call our office. If you take any of these medications: Glipizide/Metformin, Avandament, Glucavance,  please do not take 48 hours after completing test unless otherwise instructed.  We will call to schedule your test 2-4 weeks out understanding that some insurance companies will need an authorization prior to the service being performed.   For non-scheduling related questions, please contact the cardiac imaging nurse navigator should you have any questions/concerns: Marchia Bond, Cardiac Imaging Nurse Navigator Gordy Clement, Cardiac Imaging Nurse Navigator [facility] Direct Office Dial: [phone]   For scheduling needs, including cancellations and rescheduling, please call [place], [phone].

## 2021-12-07 NOTE — Progress Notes (Unsigned)
Enrolled for [employer] to mail a ZIO XT long term holter monitor to the patients address on file.  

## 2021-12-09 DIAGNOSIS — R55 Syncope and collapse: Secondary | ICD-10-CM | POA: Diagnosis not present

## 2021-12-10 ENCOUNTER — Ambulatory Visit (INDEPENDENT_AMBULATORY_CARE_PROVIDER_SITE_OTHER): Payer: BC Managed Care – PPO | Admitting: Neurology

## 2021-12-10 DIAGNOSIS — R410 Disorientation, unspecified: Secondary | ICD-10-CM | POA: Diagnosis not present

## 2021-12-10 DIAGNOSIS — R404 Transient alteration of awareness: Secondary | ICD-10-CM

## 2021-12-11 DIAGNOSIS — F3130 Bipolar disorder, current episode depressed, mild or moderate severity, unspecified: Secondary | ICD-10-CM | POA: Diagnosis not present

## 2021-12-25 ENCOUNTER — Ambulatory Visit (HOSPITAL_COMMUNITY): Payer: BC Managed Care – PPO | Attending: Cardiology

## 2021-12-25 ENCOUNTER — Other Ambulatory Visit: Payer: Self-pay

## 2021-12-25 DIAGNOSIS — R55 Syncope and collapse: Secondary | ICD-10-CM | POA: Diagnosis present

## 2021-12-25 LAB — ECHOCARDIOGRAM COMPLETE
Area-P 1/2: 4.24 cm2
S' Lateral: 2.6 cm

## 2021-12-29 ENCOUNTER — Other Ambulatory Visit: Payer: Self-pay | Admitting: Nurse Practitioner

## 2021-12-29 DIAGNOSIS — E039 Hypothyroidism, unspecified: Secondary | ICD-10-CM

## 2021-12-29 DIAGNOSIS — F411 Generalized anxiety disorder: Secondary | ICD-10-CM

## 2022-01-07 NOTE — Procedures (Signed)
° °  HISTORY: 61 year old female, with passing out spell  TECHNIQUE:  This is a routine 16 channel EEG recording with one channel devoted to a limited EKG recording.  It was performed during wakefulness, drowsiness and asleep.  Hyperventilation and photic stimulation were performed as activating procedures.  This was a technically difficult study, with low recording amplitude, frequent muscle artifact.  Upon maximum arousal, posterior dominant waking rhythm consistent of dysrhythmic mixed alpha and theta range activity. Activities are symmetric over the bilateral posterior derivations and attenuated with eye opening.  Hyperventilation produced mild/moderate buildup with higher amplitude and the slower activities noted.  Photic stimulation did not alter the tracing.  During EEG recording, patient developed drowsiness and entered sleep, sleep EEG demonstrated architecture, there was sleep spindles and K complexes noted  During EEG recording, there was no epileptiform discharge noted.  EKG demonstrate sinus rhythm, with heart rate of 76 bpm  CONCLUSION: This is a mildly abnormal awake and sleep EEG.  There is evidence of mild background slowing, common etiology are metabolic and toxic.  Levert Feinstein, M.D. Ph.D.  [facility] [address] [place], [place] [address] Phone: [phone] Fax:      [phone]

## 2022-01-11 ENCOUNTER — Other Ambulatory Visit (HOSPITAL_COMMUNITY): Payer: Self-pay | Admitting: *Deleted

## 2022-01-11 ENCOUNTER — Telehealth (HOSPITAL_COMMUNITY): Payer: Self-pay | Admitting: *Deleted

## 2022-01-11 DIAGNOSIS — R55 Syncope and collapse: Secondary | ICD-10-CM

## 2022-01-11 DIAGNOSIS — Z01812 Encounter for preprocedural laboratory examination: Secondary | ICD-10-CM

## 2022-01-11 NOTE — Telephone Encounter (Signed)
Attempted to call patient regarding upcoming cardiac CT appointment and to remind patient to obtain blood work prior to the appointment. Left message on voicemail with name and callback number  Larey Brick RN Navigator Cardiac Imaging [facility] [phone] Office [phone] Cell

## 2022-01-11 NOTE — Telephone Encounter (Signed)
Reaching out to patient to offer assistance regarding upcoming cardiac imaging study; pt verbalizes understanding of appt date/time, but states they have found a diagnosis for her symptoms.  She does not wish to purse cardiac CT testing.  Informed her that I will let Dr. Lynnette Caffey know.  Larey Brick RN Navigator Cardiac Imaging [facility] [phone] office [phone] cell

## 2022-01-13 ENCOUNTER — Ambulatory Visit (HOSPITAL_COMMUNITY): Admission: RE | Admit: 2022-01-13 | Payer: BC Managed Care – PPO | Source: Ambulatory Visit

## 2022-03-04 ENCOUNTER — Ambulatory Visit: Payer: BC Managed Care – PPO | Admitting: Adult Health

## 2022-03-04 ENCOUNTER — Encounter: Payer: Self-pay | Admitting: Adult Health

## 2022-03-04 VITALS — BP 121/71 | HR 78 | Ht 62.0 in | Wt 158.0 lb

## 2022-03-04 DIAGNOSIS — R410 Disorientation, unspecified: Secondary | ICD-10-CM | POA: Diagnosis not present

## 2022-03-04 DIAGNOSIS — R404 Transient alteration of awareness: Secondary | ICD-10-CM | POA: Diagnosis not present

## 2022-03-04 NOTE — Progress Notes (Signed)
?[facility] ?[address] ?[place]. [place] [address] ?[phone] ? ?     OFFICE FOLLOW UP NOTE ? ?[name] ?Date of Birth:  01-03-1961 ?Medical Record Number:  [id number]  ? ? ?Reason for visit: passing out spells ? ? ? ?SUBJECTIVE: ? ? ?CHIEF COMPLAINT:  ?Chief Complaint  ?Patient presents with  ? Follow-up  ?  Rm 2 alone ?Pt is well and stable, has been making lifestyle changes and doing much better   ? ? ?HPI:  ? ?Update 03/04/2022 JM: 61 year old female who returns for follow-up visit.  She is unaccompanied.  She has been doing well since prior visit without any additional passing out episodes or confusion.  She did not trial lamotrigine as previously discussed with Dr. Krista Blue as she was concerned of potential side effects.  EEG unremarkable.  She underwent some testing with cardiology, 2D echo unremarkable, cardiac monitor 3 runs of SVT without evidence of A-fib, ventricular tachyarrhythmias or bradycardia arrhythmias.  She has been working on life style changes - changed jobs, changed diet, sleeping better, increased water intake, doing medication and yoga routinely.  She has also gotten rid of a lot of other unnecessary activities that increase stress.  No new concerns at this time ? ? ? ?History provided for reference purposes only ?Consult visit 12/03/2021 Dr. Krista Blue: ?[name] is a 61 year old female, seen in request by primary care PA Margarita Mail, for evaluation of episodes of confusion, she is accompanied by her husband at today's visit, December 03, 2021 ?  ?I reviewed and summarized the referring note. PMHx. ?Depression ?Hypothyroidism ?  ?She was diagnosed with all aphal-gal allergy 2017, thought due to previous tick bite, reported stressful couple years, taking care of her elderly parents, maternal grandmother and mother suffered Alzheimer's disease ? ?She presented to episode of passing out ? 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 ?Second episode was on November 27, 2021, her husband went out for breakfast with his friend, she was still in bed before he left, about 15 minutes later, he received a phone call, confused, crying, reporting that she could not remember anything, but her husband attended her, she was able to interact with her, just confused, no motor deficit noted, he took her to the emergency room, in the standing position with triage nurse, she suddenly collapsed to the floor, went to limp, no seizure activity, lasting for few minutes, ? ?She remains confused, but was able to interact with people from 815 to 3 PM, then she cannot remember her father came to the hospital visiting her ? ?Had extensive evaluation, personally reviewed MRI of the brain without contrast November 27, 2021, no acute abnormality ? ?CT angiogram of head and neck showed no large vessel disease ?  ?Laboratory evaluations, negative UDS, ammonia, alcohol level, troponin, CMP, CBC, ? ?EEG was not performed, ?  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 ? ? ? ? ?  ROS:   ?14 system review of systems performed and negative with exception of those listed in HPI ? ?PMH:  ?Past Medical History:  ?Diagnosis Date  ? Allergy to alpha-gal   ? Cataract   ? [place] Patient virus infection   ? Heart attack ([place])   ? Idiopathic anaphylactic reaction   ? Lyme disease   ? ? ?PSH:  ?Past Surgical History:  ?Procedure  Laterality Date  ? ABDOMINAL HYSTERECTOMY    ? CATARACT EXTRACTION W/PHACO Left 12/24/2013  ? Procedure: CATARACT EXTRACTION PHACO AND INTRAOCULAR LENS PLACEMENT (IOC);  Surgeon: Tonny Branch, MD;  Location: [facility];  Service: Ophthalmology;  Laterality: Left;  CDE:  10.84  ? CATARACT EXTRACTION W/PHACO Right 01/07/2014  ? Procedure: CATARACT EXTRACTION PHACO AND INTRAOCULAR LENS PLACEMENT (IOC);  Surgeon: Tonny Branch, MD;  Location: [facility];  Service: Ophthalmology;  Laterality: Right;  CDE:7.76  ? TUBAL LIGATION    ? ? ?Social History:  ?Social History  ? ?Socioeconomic History  ? Marital status: Married  ?  Spouse name: Not on file  ? Number of children: Not on file  ? Years of education: Not on file  ? Highest education level: Not on file  ?Occupational History  ? Not on file  ?Tobacco Use  ? Smoking status: Never  ? Smokeless tobacco: Never  ?Vaping Use  ? Vaping Use: Never used  ?Substance and Sexual Activity  ? Alcohol use: Yes  ?  Comment: wine occasionally  ? Drug use: No  ? Sexual activity: Not on file  ?Other Topics Concern  ? Not on file  ?Social History Narrative  ? Not on file  ? ?Social Determinants of Health  ? ?Financial Resource Strain: Not on file  ?Food Insecurity: Not on file  ?Transportation Needs: Not on file  ?Physical Activity: Not on file  ?Stress: Not on file  ?Social Connections: Not on file  ?Intimate Partner Violence: Not on file  ? ? ?Family History:  ?Family History  ?Problem Relation Age of Onset  ? Graves' disease Mother   ? CAD Father   ? ? ?Medications:   ?Current Outpatient Medications on File Prior to Visit  ?Medication Sig Dispense Refill  ? EPINEPHrine 0.15 MG/0.15ML IJ injection Inject 0.15 mg into the muscle as needed for anaphylaxis. 2 each 3  ? estradiol (VIVELLE-DOT) 0.075 MG/24HR Place 1 patch onto the skin 2 (two) times a week. 12 patch 3  ? FLUoxetine (PROZAC) 10 MG tablet TAKE (2) TABLETS BY MOUTH TWICE DAILY. 360 tablet 0  ? levothyroxine (SYNTHROID) 50 MCG tablet TAKE 1  TABLET BY MOUTH ONCE A DAY. 90 tablet 0  ? liothyronine (CYTOMEL) 5 MCG tablet Take 5 mcg by mouth every morning.    ? NALTREXONE HCL PO Take 4 mg by mouth at bedtime.    ? nitroGLYCERIN (NITROSTAT) 0.4 MG SL tablet Place 1 tablet (0.4 mg total) under the tongue every 5 (five) minutes as needed for chest pain. 30 tablet 0  ? Nutritional Supplements (DHEA PO) Take 1 tablet by mouth daily.    ? progesterone (PROMETRIUM) 100 MG capsule Take by mouth.    ? TESTOSTERONE COMPOUNDING KIT 20 % CREA Place onto the skin. nightly    ? ?No current facility-administered medications on file prior to visit.  ? ? ?Allergies:   ?Allergies  ?Allergen Reactions  ? Cheese   ?  12/03/2021 states she has never had an allergy to cheese  ? Mango Flavor Anaphylaxis  ? Meat Extract   ?  Alpha-Gal Syndrome  ? Meperidine Hcl Other (See Comments)  ?  SYNCOPE  ? Zithromax [Azithromycin]   ? Zofran [Ondansetron] Other (See Comments)  ?  Prolonged QT  ? ? ? ? ?OBJECTIVE: ? ?Physical Exam ? ?Vitals:  ? 03/04/22 1309  ?BP: 121/71  ?Pulse: 78  ?Weight: 158 lb (71.7 kg)  ?Height: _0  (1.575 m)  ? ?Body mass index is 28.9 kg/m?Marland Kitchen ?No results found. ? ? ?General: well developed, well nourished, very pleasant middle-age Caucasian female, seated, in no evident distress ?Head: head normocephalic and atraumatic.   ?Neck: supple with no carotid or supraclavicular bruits ?Cardiovascular: regular rate and rhythm, no murmurs ?Musculoskeletal: no deformity ?Skin:  no rash/petichiae ?Vascular:  Normal pulses all extremities ?  ?Neurologic Exam ?Mental Status: Awake and fully alert. Oriented to place and time. Recent and remote memory intact. Attention span, concentration and fund of knowledge appropriate. Mood and affect appropriate.  ?Cranial Nerves:  Pupils equal, briskly reactive to light. Extraocular movements full without nystagmus. Visual fields full to confrontation. Hearing intact. Facial sensation intact. Face, tongue, palate moves normally and  symmetrically.  ?Motor: Normal bulk and tone. Normal strength in all tested extremity muscles ?Sensory.: intact to touch , pinprick , position and vibratory sensation.  ?Coordination: Rapid alternating movements normal in all

## 2022-03-04 NOTE — Patient Instructions (Signed)
Your Plan: ? ?No changes at this time ? ? ? ? ? ? ?Thank you for coming to see [place] at [facility]. I hope we have been able to provide you high quality care today. ? ?You may receive a patient satisfaction survey over the next few weeks. We would appreciate your feedback and comments so that we may continue to improve ourselves and the health of our patients. ? ?

## 2022-04-15 ENCOUNTER — Other Ambulatory Visit: Payer: Self-pay | Admitting: Nurse Practitioner

## 2022-04-15 DIAGNOSIS — F411 Generalized anxiety disorder: Secondary | ICD-10-CM

## 2022-04-22 ENCOUNTER — Ambulatory Visit: Payer: BC Managed Care – PPO | Admitting: Nurse Practitioner

## 2022-04-22 ENCOUNTER — Encounter: Payer: Self-pay | Admitting: Nurse Practitioner

## 2022-04-22 DIAGNOSIS — F411 Generalized anxiety disorder: Secondary | ICD-10-CM | POA: Diagnosis not present

## 2022-04-22 MED ORDER — FLUOXETINE HCL 10 MG PO TABS: 10.0000 mg | ORAL_TABLET | Freq: Every day | ORAL | 1 refills | Status: DC

## 2022-04-22 MED ORDER — EPINEPHRINE 0.15 MG/0.15ML IJ SOAJ: 0.1500 mg | INTRAMUSCULAR | 3 refills | Status: AC | PRN

## 2022-04-22 NOTE — Addendum Note (Signed)
Addended by: Chevis Pretty on: 04/22/2022 11:00 AM   Modules accepted: Orders

## 2022-04-22 NOTE — Patient Instructions (Signed)

## 2022-04-22 NOTE — Progress Notes (Signed)
Subjective:    Patient ID: [name], female    DOB: [birth year], 61 y.o.   MRN: [id number]   Chief Complaint: Wants referral for a sleep study (Also needs refill on Prozac/)   HPI Patient comes in needing referral for sleep study. She was scheduled during covid but was not able to get done. She says she snores at night nad does not always feel rested in morning. She says she has been waking herself up snoring lately  Wt Readings from Last 3 Encounters:  04/22/22 158 lb (71.7 kg)  03/04/22 158 lb (71.7 kg)  12/07/21 158 lb 6.4 oz (71.8 kg)   BMI Readings from Last 3 Encounters:  04/22/22 28.90 kg/m  03/04/22 28.90 kg/m  12/07/21 28.97 kg/m     Review of Systems  Constitutional:  Negative for diaphoresis.  Eyes:  Negative for pain.  Respiratory:  Negative for shortness of breath.   Cardiovascular:  Negative for chest pain, palpitations and leg swelling.  Gastrointestinal:  Negative for abdominal pain.  Endocrine: Negative for polydipsia.  Skin:  Negative for rash.  Neurological:  Negative for dizziness, weakness and headaches.  Hematological:  Does not bruise/bleed easily.  All other systems reviewed and are negative.      Objective:   Physical Exam Vitals and nursing note reviewed.  Constitutional:      General: She is not in acute distress.    Appearance: Normal appearance. She is well-developed.  Neck:     Vascular: No carotid bruit or JVD.  Cardiovascular:     Rate and Rhythm: Normal rate and regular rhythm.     Heart sounds: Normal heart sounds.  Pulmonary:     Effort: Pulmonary effort is normal. No respiratory distress.     Breath sounds: Normal breath sounds. No wheezing or rales.  Chest:     Chest wall: No tenderness.  Abdominal:     General: Bowel sounds are normal. There is no distension or abdominal bruit.     Palpations: Abdomen is soft. There is no hepatomegaly, splenomegaly, mass or pulsatile mass.     Tenderness: There is no abdominal  tenderness.  Musculoskeletal:        General: Normal range of motion.     Cervical back: Normal range of motion and neck supple.  Lymphadenopathy:     Cervical: No cervical adenopathy.  Skin:    General: Skin is warm and dry.  Neurological:     Mental Status: She is alert and oriented to person, place, and time.     Deep Tendon Reflexes: Reflexes are normal and symmetric.  Psychiatric:        Behavior: Behavior normal.        Thought Content: Thought content normal.        Judgment: Judgment normal.   BP 123/82   Pulse 74   Temp 97.8 F (36.6 C) (Temporal)   Resp 20   Ht 5\' 2"  (1.575 m)   Wt 158 lb (71.7 kg)   SpO2 96%   BMI 28.90 kg/m        Assessment & Plan:   [name] in today with chief complaint of Wants referral for a sleep study (Also needs refill on Prozac/)   1. Primary sleep apnea of newborn, unspecified type Bedtime routine - [facility] Sleep Study; Future    The above assessment and management plan was discussed with the patient. The patient verbalized understanding of and has agreed to the management plan. Patient is  aware to call the clinic if symptoms persist or worsen. Patient is aware when to return to the clinic for a follow-up visit. Patient educated on when it is appropriate to go to the emergency department.   [name], FNP

## 2022-04-29 ENCOUNTER — Telehealth: Payer: Self-pay | Admitting: Nurse Practitioner

## 2022-05-06 NOTE — Telephone Encounter (Signed)
Please check on sleep study and ordered on / and call patient

## 2022-05-11 ENCOUNTER — Other Ambulatory Visit: Payer: Self-pay | Admitting: Nurse Practitioner

## 2022-05-11 DIAGNOSIS — R5383 Other fatigue: Secondary | ICD-10-CM

## 2022-05-11 DIAGNOSIS — R0683 Snoring: Secondary | ICD-10-CM

## 2022-05-11 NOTE — Progress Notes (Signed)
Ef sleep

## 2022-06-29 ENCOUNTER — Ambulatory Visit (INDEPENDENT_AMBULATORY_CARE_PROVIDER_SITE_OTHER): Payer: BC Managed Care – PPO | Admitting: Neurology

## 2022-06-29 ENCOUNTER — Encounter: Payer: Self-pay | Admitting: Neurology

## 2022-06-29 VITALS — BP 132/75 | HR 68 | Ht 62.0 in | Wt 161.0 lb

## 2022-06-29 DIAGNOSIS — E663 Overweight: Secondary | ICD-10-CM

## 2022-06-29 DIAGNOSIS — G4730 Sleep apnea, unspecified: Secondary | ICD-10-CM | POA: Diagnosis not present

## 2022-06-29 DIAGNOSIS — R0683 Snoring: Secondary | ICD-10-CM

## 2022-06-29 DIAGNOSIS — G4710 Hypersomnia, unspecified: Secondary | ICD-10-CM

## 2022-06-29 DIAGNOSIS — R635 Abnormal weight gain: Secondary | ICD-10-CM

## 2022-06-29 DIAGNOSIS — Z820 Family history of epilepsy and other diseases of the nervous system: Secondary | ICD-10-CM

## 2022-06-29 DIAGNOSIS — R0681 Apnea, not elsewhere classified: Secondary | ICD-10-CM

## 2022-06-29 DIAGNOSIS — G4719 Other hypersomnia: Secondary | ICD-10-CM

## 2022-06-29 DIAGNOSIS — R519 Headache, unspecified: Secondary | ICD-10-CM

## 2022-06-29 NOTE — Patient Instructions (Signed)

## 2022-06-29 NOTE — Progress Notes (Signed)
Subjective:    Patient ID: [name] is a 61 y.o. female.  HPI    Star Age, MD, PhD [facility] [address] [place], [place] [address]  Dear [name],   I saw your patient, [name], upon your kind request in my sleep clinic today for initial consultation of her sleep disorder, in particular, concern for underlying obstructive sleep apnea.  The patient is unaccompanied today.  As you know, Ms. [name] is a 61 year old right-handed woman with an underlying medical history of thyroidism, anxiety, syncope and episodes of confusion (for which she saw my colleagues, Dr. Krista Blue and Frann Rider, NP, recently), hyperlipidemia, and overweight state, who reports snoring and excessive daytime somnolence.  I reviewed your office note from 04/22/2022.  Her Epworth sleepiness score is 16 out of 24, fatigue severity score is 61 out of 63.  She reports a family history of sleep apnea affecting her father who is currently not on a CPAP machine.  She is retired from [employer], [employer], she works as a Contractor now.  She has experienced sleepiness at the wheel but gives herself extra time to pull over and take a nap if needed.  She has not had a car accident from falling asleep at the wheel but does get sleepy for her longer distance drives, she does have to travel by road and by air for her work.  She lives with her husband and has 3 grown children, in [place], [place] and [place].  She has a history of SVT and has seen cardiology.  She drinks caffeine in the form of tea, 2 cups/day, alcohol 1 glass of wine on a Saturday typically, she is a non-smoker.  She has nocturia less than once per night, she has woken up with a headache, she has gained weight in the past 2 years in the realm of 30 pounds.  She has been noted to have pauses in her breathing while asleep as reported by her husband and also a friend when they shared a room during a cruise  recently.  She does not watch TV in her bedroom.    Her Past Medical History Is Significant For: Past Medical History:  Diagnosis Date   Allergy to alpha-gal    Cataract    Epstein Barr virus infection    Heart attack ([place])    Idiopathic anaphylactic reaction    Lyme disease     Her Past Surgical History Is Significant For: Past Surgical History:  Procedure Laterality Date   ABDOMINAL HYSTERECTOMY     CATARACT EXTRACTION W/PHACO Left 12/24/2013   Procedure: CATARACT EXTRACTION PHACO AND INTRAOCULAR LENS PLACEMENT ([place]);  Surgeon: Tonny Branch, MD;  Location: [facility];  Service: Ophthalmology;  Laterality: Left;  CDE:  10.84   CATARACT EXTRACTION W/PHACO Right 01/07/2014   Procedure: CATARACT EXTRACTION PHACO AND INTRAOCULAR LENS PLACEMENT ([facility]);  Surgeon: Tonny Branch, MD;  Location: [facility];  Service: Ophthalmology;  Laterality: Right;  CDE:7.76   TUBAL LIGATION      Her Family History Is Significant For: Family History  Problem Relation Age of Onset   Graves' disease Mother    CAD Father    Sleep apnea Neg Hx     Her Social History Is Significant For: Social History   Socioeconomic History   Marital status: Married    Spouse name: Not on file   Number of children: Not on file   Years of education: Not on file  Highest education level: Not on file  Occupational History   Not on file  Tobacco Use   Smoking status: Never   Smokeless tobacco: Never  Vaping Use   Vaping Use: Never used  Substance and Sexual Activity   Alcohol use: Yes    Alcohol/week: 1.0 standard drink of alcohol    Types: 1 Glasses of wine per week    Comment: wine occasionally   Drug use: No   Sexual activity: Not on file  Other Topics Concern   Not on file  Social History Narrative   Not on file   Social Determinants of Health   Financial Resource Strain: Not on file  Food Insecurity: Not on file  Transportation Needs: Not on file  Physical Activity: Not on file  Stress: Not on file  Social  Connections: Not on file    Her Allergies Are:  Allergies  Allergen Reactions   Cheese     12/03/2021 states she has never had an allergy to cheese   Mango Flavor Anaphylaxis   Meat Extract     Alpha-Gal Syndrome   Meperidine Hcl Other (See Comments)    SYNCOPE   Zithromax [Azithromycin]    Zofran [Ondansetron] Other (See Comments)    Prolonged QT  :   Her Current Medications Are:  Outpatient Encounter Medications as of 06/29/2022  Medication Sig   EPINEPHrine 0.15 MG/0.15ML IJ injection Inject 0.15 mg into the muscle as needed for anaphylaxis.   estradiol (VIVELLE-DOT) 0.075 MG/24HR Place 1 patch onto the skin 2 (two) times a week.   FLUoxetine (PROZAC) 10 MG tablet Take 1 tablet (10 mg total) by mouth daily.   levothyroxine (SYNTHROID) 50 MCG tablet TAKE 1 TABLET BY MOUTH ONCE A DAY.   liothyronine (CYTOMEL) 5 MCG tablet Take 5 mcg by mouth every morning.   NALTREXONE HCL PO Take 4 mg by mouth at bedtime.   nitroGLYCERIN (NITROSTAT) 0.4 MG SL tablet Place 1 tablet (0.4 mg total) under the tongue every 5 (five) minutes as needed for chest pain.   Nutritional Supplements (DHEA PO) Take 1 tablet by mouth daily.   progesterone (PROMETRIUM) 100 MG capsule Take by mouth.   TESTOSTERONE COMPOUNDING KIT 20 % CREA Place onto the skin. nightly   No facility-administered encounter medications on file as of 06/29/2022.  :   Review of Systems:  Out of a complete 14 point review of systems, all are reviewed and negative with the exception of these symptoms as listed below:  Review of Systems  Neurological:        Pt here for sleep consult   Pt snores, headaches,fatigue  Pt denies hypertension,sleep study and CPAP machine     ESS:16 FSS:61     Objective:  Neurological Exam  Physical Exam Physical Examination:   Vitals:   06/29/22 1026  BP: 132/75  Pulse: 68    General Examination: The patient is a very pleasant 61 y.o. female in no acute distress. She appears  well-developed and well-nourished and well groomed.   HEENT: Normocephalic, atraumatic, pupils are equal, round and reactive to light, extraocular tracking is good without limitation to gaze excursion or nystagmus noted. Hearing is grossly intact. Face is symmetric with normal facial animation. Speech is clear with no dysarthria noted. There is no hypophonia. There is no lip, neck/head, jaw or voice tremor. Neck is supple with full range of passive and active motion. There are no carotid bruits on auscultation. Oropharynx exam reveals: mild mouth dryness, good dental  hygiene and moderate airway crowding, due to small airway, Mallampati is class IV. Tongue protrudes centrally, uvula and tonsils are not fully visualized.  Neck circumference is 14-1/2 inches.  She has a mild overbite.    Chest: Clear to auscultation without wheezing, rhonchi or crackles noted.  Heart: S1+S2+0, regular and normal without murmurs, rubs or gallops noted.   Abdomen: Soft, non-tender and non-distended.  Extremities: There is no pitting edema in the distal lower extremities bilaterally.   Skin: Warm and dry without trophic changes noted.   Musculoskeletal: exam reveals no obvious joint deformities, tenderness or joint swelling or erythema.   Neurologically:  Mental status: The patient is awake, alert and oriented in all 4 spheres. Her immediate and remote memory, attention, language skills and fund of knowledge are appropriate. There is no evidence of aphasia, agnosia, apraxia or anomia. Speech is clear with normal prosody and enunciation. Thought process is linear. Mood is normal and affect is normal.  Cranial nerves II - XII are as described above under HEENT exam.  Motor exam: Normal bulk, strength and tone is noted. There is no obvious tremor.  Fine motor skills and coordination: grossly intact.  Cerebellar testing: No dysmetria or intention tremor. There is no truncal or gait ataxia.  Sensory exam: intact to light  touch in the upper and lower extremities.  Gait, station and balance: She stands easily. No veering to one side is noted. No leaning to one side is noted. Posture is age-appropriate and stance is narrow based. Gait shows normal stride length and normal pace. No problems turning are noted.   Assessment and Plan:  In summary, TREVA HUYETT is a very pleasant 61 y.o.-year old female with an underlying medical history of thyroidism, anxiety, syncope and episodes of confusion (for which she saw my colleagues, Dr. Krista Blue and Frann Rider, NP, recently), hyperlipidemia, and overweight state, whose history and physical exam concerning for sleep disordered breathing, supporting a current working diagnosis of unspecified sleep apnea, with the main differential diagnoses of obstructive sleep apnea (OSA) versus upper airway resistance syndrome (UARS) versus central sleep apnea (CSA), or mixed sleep apnea. A laboratory attended sleep study is considered gold standard for evaluation of sleep disordered breathing and is recommended at this time and clinically justified.   I had a long chat with the patient about my findings and the diagnosis of sleep apnea, particularly OSA, its prognosis and treatment options. We talked about medical/conservative treatments, surgical interventions and non-pharmacological approaches for symptom control. I explained, in particular, the risks and ramifications of untreated moderate to severe OSA, especially with respect to developing cardiovascular disease down the road, including congestive heart failure (CHF), difficult to treat hypertension, cardiac arrhythmias (particularly A-fib), neurovascular complications including TIA, stroke and dementia. Even type 2 diabetes has, in part, been linked to untreated OSA. Symptoms of untreated OSA may include (but may not be limited to) daytime sleepiness, nocturia (i.e. frequent nighttime urination), memory problems, mood irritability and suboptimally  controlled or worsening mood disorder such as depression and/or anxiety, lack of energy, lack of motivation, physical discomfort, as well as recurrent headaches, especially morning or nocturnal headaches. We talked about the importance of maintaining a healthy lifestyle and striving for healthy weight. In addition, we talked about the importance of striving for and maintaining good sleep hygiene. I recommended the following at this time: sleep study.  I outlined the differences between a laboratory attended sleep study which is considered more comprehensive and accurate over the option of  a home sleep test (HST); the latter may lead to underestimation of sleep disordered breathing in some instances and does not help with diagnosing upper airway resistance syndrome and is not accurate enough to diagnose primary central sleep apnea typically. I explained the different sleep test procedures to the patient in detail and also outlined possible surgical and non-surgical treatment options of OSA, including the use of a pressure airway pressure (PAP) device (ie CPAP, AutoPAP/APAP or BiPAP in certain circumstances), a custom-made dental device (aka oral appliance, which would require a referral to a specialist dentist or orthodontist typically, and is generally speaking not considered a good choice for patients with full dentures or edentulous state), upper airway surgical options, such as traditional UPPP (which is not considered a first-line treatment) or the Inspire device (hypoglossal nerve stimulator, which would involve a referral for consultation with an ENT surgeon, after careful selection, following inclusion criteria). I explained the PAP treatment option to the patient in detail, as this is generally considered first-line treatment.  The patient indicated that she would be willing to try PAP therapy, if the need arises. I explained the importance of being compliant with PAP treatment, not only for insurance  purposes but primarily to improve patient's symptoms symptoms, and for the patient's long term health benefit, including to reduce Her cardiovascular risks longer-term.    We will pick up our discussion about the next steps and treatment options after testing.  We will keep her posted as to the test results by phone call and/or MyChart messaging where possible.  We will plan to follow-up in sleep clinic accordingly as well.  I answered all her questions today and the patient was in agreement.   I encouraged her to call with any interim questions, concerns, problems or updates or email [place] through [place].  Generally speaking, sleep test authorizations may take up to 2 weeks, sometimes less, sometimes longer, the patient is encouraged to get in touch with [place] if they do not hear back from the sleep lab staff directly within the next 2 weeks.  Thank you very much for allowing me to participate in the care of this nice patient. If I can be of any further assistance to you please do not hesitate to call me at [phone].  Sincerely,   Star Age, MD, PhD

## 2022-06-30 ENCOUNTER — Telehealth: Payer: Self-pay | Admitting: Neurology

## 2022-06-30 NOTE — Telephone Encounter (Signed)
NPSG- BCBS state no auth req  Patient is scheduled at [facility] for 07/27/22 at 8 pm.  Mailed packet to the patient.

## 2022-07-09 ENCOUNTER — Ambulatory Visit (INDEPENDENT_AMBULATORY_CARE_PROVIDER_SITE_OTHER): Payer: BC Managed Care – PPO | Admitting: Nurse Practitioner

## 2022-07-09 ENCOUNTER — Encounter: Payer: Self-pay | Admitting: Nurse Practitioner

## 2022-07-09 VITALS — BP 111/76 | HR 74 | Temp 97.8°F | Resp 20 | Ht 62.0 in | Wt 163.0 lb

## 2022-07-09 DIAGNOSIS — J40 Bronchitis, not specified as acute or chronic: Secondary | ICD-10-CM | POA: Diagnosis not present

## 2022-07-09 MED ORDER — METHYLPREDNISOLONE ACETATE 80 MG/ML IJ SUSP
80.0000 mg | Freq: Once | INTRAMUSCULAR | Status: AC
  Administered 2022-07-09: 80 mg via INTRAMUSCULAR

## 2022-07-09 MED ORDER — HYDROCODONE BIT-HOMATROP MBR 5-1.5 MG/5ML PO SOLN: 5.0000 mL | Freq: Three times a day (TID) | ORAL | 0 refills | Status: DC | PRN

## 2022-07-09 MED ORDER — PREDNISONE 20 MG PO TABS: 40.0000 mg | ORAL_TABLET | Freq: Every day | ORAL | 0 refills | Status: DC

## 2022-07-09 NOTE — Progress Notes (Signed)
Subjective:    Patient ID: MARSHAE AZAM, female    DOB: 07-10-1961, 61 y.o.   MRN: [id number]   Chief Complaint: Cough   Patient comes in today c/o cough.  Cough This is a new problem. The current episode started in the past 7 days. The problem has been waxing and waning. The problem occurs constantly. The cough is Productive of sputum. Associated symptoms include rhinorrhea. Pertinent negatives include no chills, ear congestion or sore throat (had sore throat last week). Nothing aggravates the symptoms. She has tried OTC cough suppressant for the symptoms. The treatment provided mild relief.       Review of Systems  Constitutional:  Negative for chills.  HENT:  Positive for rhinorrhea. Negative for sore throat (had sore throat last week).   Respiratory:  Positive for cough.        Objective:   Physical Exam Vitals and nursing note reviewed.  Constitutional:      General: She is not in acute distress.    Appearance: Normal appearance. She is well-developed.  HENT:     Head: Normocephalic.     Right Ear: Tympanic membrane normal.     Left Ear: Tympanic membrane normal.     Nose: Congestion and rhinorrhea present.     Mouth/Throat:     Mouth: Mucous membranes are moist.  Eyes:     Pupils: Pupils are equal, round, and reactive to light.  Neck:     Vascular: No carotid bruit or JVD.  Cardiovascular:     Rate and Rhythm: Normal rate and regular rhythm.     Heart sounds: Normal heart sounds.  Pulmonary:     Effort: Pulmonary effort is normal. No respiratory distress.     Breath sounds: Normal breath sounds. No wheezing or rales.     Comments: Deep dry cough Chest:     Chest wall: No tenderness.  Abdominal:     General: There is no distension or abdominal bruit.     Palpations: There is no hepatomegaly, splenomegaly, mass or pulsatile mass.     Tenderness: There is no abdominal tenderness.  Musculoskeletal:        General: Normal range of motion.     Cervical  back: Normal range of motion and neck supple.  Lymphadenopathy:     Cervical: No cervical adenopathy.  Skin:    General: Skin is warm and dry.  Neurological:     Mental Status: She is alert and oriented to person, place, and time.     Deep Tendon Reflexes: Reflexes are normal and symmetric.  Psychiatric:        Behavior: Behavior normal.        Thought Content: Thought content normal.        Judgment: Judgment normal.    BP 111/76   Pulse 74   Temp 97.8 F (36.6 C) (Temporal)   Resp 20   Ht 5\' 2"  (1.575 m)   Wt 163 lb (73.9 kg)   SpO2 96%   BMI 29.81 kg/m         Assessment & Plan:  QUANDRA FEDORCHAK in today with chief complaint of Cough   1. Bronchitis 1. Take meds as prescribed 2. Use a cool mist humidifier especially during the winter months and when heat has been humid. 3. Use saline nose sprays frequently 4. Saline irrigations of the nose can be very helpful if done frequently.  * 4X daily for 1 week*  * Use of a nettie  pot can be helpful with this. Follow directions with this* 5. Drink plenty of fluids 6. Keep thermostat turn down low 7.For any cough or congestion- hycodan 8. For fever or aces or pains- take tylenol or ibuprofen appropriate for age and weight.  * for fevers greater than 101 orally you may alternate ibuprofen and tylenol every  3 hours.    - predniSONE (DELTASONE) 20 MG tablet; Take 2 tablets (40 mg total) by mouth daily with breakfast for 5 days. 2 po daily for 5 days  Dispense: 10 tablet; Refill: 0 - methylPREDNISolone acetate (DEPO-MEDROL) injection 80 mg - HYDROcodone bit-homatropine (HYCODAN) 5-1.5 MG/5ML syrup; Take 5 mLs by mouth every 8 (eight) hours as needed for cough.  Dispense: 120 mL; Refill: 0    The above assessment and management plan was discussed with the patient. The patient verbalized understanding of and has agreed to the management plan. Patient is aware to call the clinic if symptoms persist or worsen. Patient is aware  when to return to the clinic for a follow-up visit. Patient educated on when it is appropriate to go to the emergency department.   [name] Daphine Deutscher, FNP

## 2022-07-14 ENCOUNTER — Other Ambulatory Visit: Payer: Self-pay | Admitting: Family Medicine

## 2022-07-14 ENCOUNTER — Telehealth: Payer: Self-pay | Admitting: Nurse Practitioner

## 2022-07-14 DIAGNOSIS — J40 Bronchitis, not specified as acute or chronic: Secondary | ICD-10-CM

## 2022-07-14 MED ORDER — PREDNISONE 20 MG PO TABS: 40.0000 mg | ORAL_TABLET | Freq: Every day | ORAL | 0 refills | Status: AC

## 2022-07-14 MED ORDER — AMOXICILLIN-POT CLAVULANATE 875-125 MG PO TABS: 1.0000 | ORAL_TABLET | Freq: Two times a day (BID) | ORAL | 0 refills | Status: DC

## 2022-07-14 NOTE — Telephone Encounter (Signed)
I sent an antibiotic and a renewal of her prednisone to [facility]

## 2022-07-14 NOTE — Telephone Encounter (Signed)
  Incoming Patient Call  07/14/2022  What symptoms do you have? Still having a cough and has gotten worse and not sleeping from the cough. The cough is breaking up mucus in chest.  How long have you been sick? Two weeks  Have you been seen for this problem? Yes 8-25 with MMM  If your provider decides to give you a prescription, which pharmacy would you like for it to be sent to? [facility] in [place]   Patient informed that this information will be sent to the clinical staff for review and that they should receive a follow up call.

## 2022-07-14 NOTE — Telephone Encounter (Signed)
Called patient, no answer 

## 2022-07-27 ENCOUNTER — Ambulatory Visit (INDEPENDENT_AMBULATORY_CARE_PROVIDER_SITE_OTHER): Payer: BC Managed Care – PPO | Admitting: Neurology

## 2022-07-27 DIAGNOSIS — E663 Overweight: Secondary | ICD-10-CM

## 2022-07-27 DIAGNOSIS — R0683 Snoring: Secondary | ICD-10-CM

## 2022-07-27 DIAGNOSIS — G4710 Hypersomnia, unspecified: Secondary | ICD-10-CM

## 2022-07-27 DIAGNOSIS — R519 Headache, unspecified: Secondary | ICD-10-CM

## 2022-07-27 DIAGNOSIS — Z820 Family history of epilepsy and other diseases of the nervous system: Secondary | ICD-10-CM

## 2022-07-27 DIAGNOSIS — G4730 Sleep apnea, unspecified: Secondary | ICD-10-CM

## 2022-07-27 DIAGNOSIS — G4733 Obstructive sleep apnea (adult) (pediatric): Secondary | ICD-10-CM

## 2022-07-27 DIAGNOSIS — G4719 Other hypersomnia: Secondary | ICD-10-CM

## 2022-07-27 DIAGNOSIS — R635 Abnormal weight gain: Secondary | ICD-10-CM

## 2022-07-27 DIAGNOSIS — R0681 Apnea, not elsewhere classified: Secondary | ICD-10-CM

## 2022-07-27 DIAGNOSIS — G4720 Circadian rhythm sleep disorder, unspecified type: Secondary | ICD-10-CM

## 2022-07-28 NOTE — Telephone Encounter (Signed)
Na  ?No call back  ?This encounter will be closed  ?

## 2022-08-09 ENCOUNTER — Telehealth: Payer: Self-pay | Admitting: Neurology

## 2022-08-09 NOTE — Telephone Encounter (Signed)
I called the pt & LVM (ok per DPR) advising patient of sleep study results. I advised the patient that our office would obtain insurance authorization for the titration study and then call her to schedule. Titration study will be completed to best determine the CPAP settings for patient. Left office number for call back if needed.

## 2022-08-09 NOTE — Telephone Encounter (Signed)
Star Age, MD  08/09/2022  2:38 PM EDT     Patient referred by PCP, seen by me on 06/29/22, diagnostic PSG on 07/27/22.   Please call and notify the patient that the recent sleep study showed moderate to severe obstructive sleep apnea. I recommend treatment for this in the form of CPAP. This will require a repeat sleep study for proper titration and mask fitting and correct monitoring of the oxygen saturations. Please explain to patient. I have placed an order in the chart. Thanks.   Star Age, MD, PhD [facility])

## 2022-08-09 NOTE — Procedures (Signed)
[facility] POLYSOMNOGRAPHY  INTERPRETATION REPORT   STUDY DATE:  07/27/2022     PATIENT NAME:  [name]         DATE OF BIRTH:  08/23/1961  PATIENT ID:  [id number]    TYPE OF STUDY:  PSG  READING PHYSICIAN: Star Age, MD, PhD REFERRED BY: Chevis Pretty, NP SCORING TECHNICIAN: Forde Radon, RPSGT  HISTORY: 61 year old woman with a history of thyroidism, anxiety, syncope and episodes of confusion, hyperlipidemia, and overweight state, who reports snoring and excessive daytime somnolence. Her Epworth sleepiness score is 16 out of 24, fatigue severity score is 61 out of 63. Height: 62 in Weight: 161 lb (BMI 29) Neck Size: 15 in  MEDICATIONS: Epinephrine, Vivelle DOT, Prozac, Synthroid, Cytomel, Naltrexone HCL, Nitrostat, DHEA, Prometrium, Testosterone TECHNICAL DESCRIPTION: A registered sleep technologist was in attendance for the duration of the recording.  Data collection, scoring, video monitoring, and reporting were performed in compliance with the AASM Manual for the Scoring of Sleep and Associated Events; (Hypopnea is scored based on the criteria listed in Section VIII D. 1b in the AASM Manual V2.6 using a 4% oxygen desaturation rule or Hypopnea is scored based on the criteria listed in Section VIII D. 1a in the AASM Manual V2.6 using 3% oxygen desaturation and /or arousal rule).   SLEEP CONTINUITY AND SLEEP ARCHITECTURE:  Lights-out was at 20:18: and lights-on at  04:53:, with a total sleep time of 8 hours and 34 minutes. Total sleep time ( TST) was 451.0 minutes with a normal sleep efficiency at 87.7%. There was  12.6% REM sleep.  BODY POSITION:  TST was divided  between the following sleep positions: 94.6% supine;  5.4% lateral;  0% prone. Duration of total sleep and percent of total sleep in their respective position is as follows: supine 426 minutes (95%), non-supine 25 minutes (5%); right 24 minutes (5%), left 00 minutes (0%), and prone  00 minutes (0%).  Total supine REM sleep time was 57 minutes (100% of total REM sleep). Sleep latency was normal at 9.0 minutes.  REM sleep latency was increased at 114.5 minutes. Of the total sleep time, the percentage of stage N1 sleep was 2.3%, stage N2 sleep was 83%, which is markedly increased, stage N3 sleep was 2.3%, and REM sleep was 12.6%, which is reduced. Wake after sleep onset (WASO) time accounted for 54 minutes, with mild to moderate sleep fragmentation noted.  RESPIRATORY MONITORING:  Based on CMS criteria (using a 4% oxygen desaturation rule for scoring hypopneas), there were 43 apneas (43 obstructive; 0 central; 0 mixed), and 97 hypopneas.  Apnea index was 5.7. Hypopnea index was 12.9. The apnea-hypopnea index was 18.6 overall (19.7 supine, 0 non-supine; 52.6 REM, 52.6 supine REM).  There were 0 respiratory effort-related arousals (RERAs).  The RERA index was 0 events/h. Total respiratory disturbance index (RDI) was 18.6 events/h. RDI results showed: supine RDI  19.7 /h; non-supine RDI 0.0 /h; REM RDI 52.6 /h, supine REM RDI 52.6 /h.   Based on AASM criteria (using a 3% oxygen desaturation and /or arousal rule for scoring hypopneas), there were 43 apneas (43 obstructive; 0 central; 0 mixed), and 178 hypopneas. Apnea index was 5.7. Hypopnea index was 23.7. The apnea-hypopnea index was 29.4/hour overall (31.1 supine, 0 non-supine; 53.7 REM, 53.7 supine REM).  There were 0 respiratory effort-related arousals (RERAs).  The RERA index was 0 events/h. Total respiratory disturbance index (RDI) was 29.4 events/h. RDI results showed: supine RDI  31.1 /h;  non-supine RDI 0.0 /h; REM RDI 53.7 /h, supine REM RDI 53.7 /h.  OXIMETRY: Oxyhemoglobin Saturation Nadir during sleep was at  82%) from a mean of 95%.  Of the Total sleep time (TST)   hypoxemia (=<88%) was present for  3.4 minutes, or 0.8% of total sleep time.  LIMB MOVEMENTS: There were 53 periodic limb movements of sleep (7.1/hr), of which 0  (0.0/hr) were associated with an arousal. AROUSAL: There were 40 arousals in total, for an arousal index of 5 arousals/hour.  Of these, 31 were identified as respiratory-related arousals (4 /h), 0 were PLM-related arousals (0 /h), and 30 were non-specific arousals (4 /h). Snoring was classified as intermittent, mild to moderate. EEG:  The EEG was of normal amplitude and frequency, with symmetric manifestation of sleep stages. EKG: The electrocardiogram showed normal sinus rhythm.  The average heart rate during sleep was 56 bpm.  The heart rate during sleep varied between a minimum of N/A and  a maximum of  89 bpm. AUDIO and VIDEO:  The video and audio analysis did not show any abnormal or unusual behaviors, movements, phonations or vocalizations. The patient took 1 bathroom break for the night. Post study, the patient indicated, that sleep was better than usual.  IMPRESSION: 1. Obstructive sleep apnea (OSA) 2. Dysfunctions associated with sleep stages or arousal from sleep   Recommendation:   1. This study demonstrates moderate to severe obstructive sleep apnea, with a total AHI of 29.4/hour, REM AHI of 53.7/hour, supine AHI of 31.1/hour and O2 nadir of 82%. Treatment with positive airway pressure in the form of CPAP is recommended. This will require - ideally - a full night titration study to optimize therapy settings, mask fit, monitoring of tolerance and of proper oxygen saturations. Other treatment options may be limited, and may include (generally speaking) surgical options in selected patients or the use of an oral appliance in certain patients. Concomitant weight loss is recommended (where clinically appropriate). Please note that untreated obstructive sleep apnea may carry additional perioperative morbidity. Patients with significant obstructive sleep apnea should receive perioperative PAP therapy and the surgeons and particularly the anesthesiologist should be informed of the diagnosis and  the severity of the sleep disordered breathing. 2. This study shows mild to moderate sleep fragmentation and abnormal sleep stage percentages; these are nonspecific findings and per se do not signify an intrinsic sleep disorder or a cause for the patient's sleep-related symptoms. Causes include (but are not limited to) the first night effect of the sleep study, circadian rhythm disturbances, medication effect or an underlying mood disorder or medical problem.  3. The patient should be cautioned not to drive, work at heights, or operate dangerous or heavy equipment when tired or sleepy. Review and reiteration of good sleep hygiene measures should be pursued with any patient. 4. The patient will be seen in follow-up in the sleep clinic at [facility] for discussion of the test results, symptom and treatment compliance review, further management strategies, etc. The patient and her referring provider will be notified of the test results.   I certify that I have reviewed the entire raw data recording prior to the issuance of this report in accordance with the Standards of Accreditation of the American Academy of Sleep Medicine (AASM). Huston Foley, MD, PhD   [facility]) Diplomat, American Board of Psychiatry and Neurology (Board certified in Neurology and Sleep medicine)

## 2022-08-09 NOTE — Addendum Note (Signed)
Addended by: Star Age on: 08/09/2022 02:38 PM   Modules accepted: Orders

## 2022-08-09 NOTE — Telephone Encounter (Signed)
Pt asking to be called with results to her sleep study from about 2 weeks ago

## 2022-08-10 ENCOUNTER — Telehealth: Payer: Self-pay | Admitting: Neurology

## 2022-08-10 NOTE — Telephone Encounter (Signed)
CPAP Titration- [employer] state no auth req.  Patient is scheduled at [facility] for 08/11/22 at 9 pm.

## 2022-08-11 ENCOUNTER — Ambulatory Visit (INDEPENDENT_AMBULATORY_CARE_PROVIDER_SITE_OTHER): Payer: BC Managed Care – PPO | Admitting: Neurology

## 2022-08-11 DIAGNOSIS — Z820 Family history of epilepsy and other diseases of the nervous system: Secondary | ICD-10-CM

## 2022-08-11 DIAGNOSIS — G4710 Hypersomnia, unspecified: Secondary | ICD-10-CM

## 2022-08-11 DIAGNOSIS — G4720 Circadian rhythm sleep disorder, unspecified type: Secondary | ICD-10-CM

## 2022-08-11 DIAGNOSIS — E663 Overweight: Secondary | ICD-10-CM

## 2022-08-11 DIAGNOSIS — G4733 Obstructive sleep apnea (adult) (pediatric): Secondary | ICD-10-CM | POA: Diagnosis not present

## 2022-08-11 DIAGNOSIS — R635 Abnormal weight gain: Secondary | ICD-10-CM

## 2022-08-11 DIAGNOSIS — R519 Headache, unspecified: Secondary | ICD-10-CM

## 2022-08-11 DIAGNOSIS — G4719 Other hypersomnia: Secondary | ICD-10-CM

## 2022-08-17 NOTE — Addendum Note (Signed)
Addended by: Star Age on: 08/17/2022 07:10 PM   Modules accepted: Orders

## 2022-08-17 NOTE — Procedures (Signed)
[facility] PAP TITRATION INTERPRETATION REPORT   STUDY DATE: 08/11/2022      PATIENT NAME:  [name]         DATE OF BIRTH:  Oct 29, 1961  PATIENT ID:  [id number]    TYPE OF STUDY:  CPAP  READING PHYSICIAN: Huston Foley, MD, PhD SCORING TECHNICIAN: Domingo Cocking, RPSGT   HISTORY: 61 year old woman with a history of thyroidism, anxiety, syncope and episodes of confusion, hyperlipidemia, and overweight state, who presents for a full night titration study. Her baseline sleep study from 07/27/2022 showed moderate to severe obstructive sleep apnea, with a total AHI of 29.4/hour, REM AHI of 53.7/hour, supine AHI of 31.1/hour and O2 nadir of 82%. Height: 62.0 in Weight: 161 lb (BMI 29) Neck Size: 14.5 in    MEDICATIONS: Epinephrine, Vivelle DOT, Prozac, Synthroid, Cytomel, Naltrexone HCL, Nitrostat, DHEA, Prometrium, Testosterone DESCRIPTION: A sleep technologist was in attendance for the duration of the recording.  Data collection, scoring, video monitoring, and reporting were performed in compliance with the AASM Manual for the Scoring of Sleep and Associated Events; (Hypopnea is scored based on the criteria listed in Section VIII D. 1b in the AASM Manual V2.6 using a 4% oxygen desaturation rule or Hypopnea is scored based on the criteria listed in Section VIII D. 1a in the AASM Manual V2.6 using 3% oxygen desaturation and /or arousal rule).  A physician certified by the American Board of Sleep Medicine reviewed each epoch of the study.   SLEEP CONTINUITY AND SLEEP ARCHITECTURE:  Lights off was at 22:04: and lights on 05:01: (total recording time of 6 hours, 57 minutes). Total sleep time was 379.5 minutes (100.0% supine;  0.0% lateral;  0.0% prone, 29.4% REM sleep), with a normal sleep efficiency at 91.0%. Sleep latency was normal at 8.5 minutes.  Of the total sleep time, the percentage of stage N1 sleep was 2.6%, stage N2 sleep was 52.4%, which is normal, stage  N3 sleep was 15.5%, which is normal, and REM sleep was 29.4%, which is increased, and in keeping with rebound. Wake after sleep onset (WASO) time accounted for 29 minutes with mild sleep fragmentation noted.  AROUSAL: There were 28 arousals in total, for an arousal index of 4.4 arousals/hour.  Of these, 2 were identified as respiratory-related arousals (0.3 /h), 1 were PLM-related arousals (0.2 /h), and 33 were non-specific arousals (5.2 /h)  RESPIRATORY MONITORING:  Based on CMS criteria (using a 4% oxygen desaturation rule for scoring hypopneas), there were 2 apneas (2 obstructive; 0 central; 0 mixed), and 2 hypopneas.  Apnea index was 0.3. Hypopnea index was 0.3. The apnea-hypopnea index was 0.6 overall (0.6 supine, 0.0 non-supine; 2.2 REM, 2.2 supine REM). There were 0 respiratory effort-related arousals (RERAs).  The RERA index was 0.0 events/h. Total respiratory disturbance index (RDI) was 0.6 events/h. RDI results showed: supine RDI  0.6 /h; non-supine RDI 0.0 /h; REM RDI 2.2 /h, supine REM RDI 2.2 /h.   Based on AASM criteria (using a 3% oxygen desaturation and /or arousal rule for scoring hypopneas), there were 2 apneas (2 obstructive; 0 central; 0 mixed), and 2 hypopneas. Apnea index was 0.3. Hypopnea index was 0.3. The apnea-hypopnea index was 0.6 overall (0.6 supine, 0.0 non-supine; 2.2 REM, 2.2 supine REM). There were 0 respiratory effort-related arousals (RERAs).  The RERA index was 0.0 events/h. Total respiratory disturbance index (RDI) was 0.6 events/h. RDI results showed: supine RDI  0.6 /h; non-supine RDI 0.0 /h; REM RDI 2.2 /h, supine  REM RDI 2.2 /h.  Respiratory events were associated with oxyhemoglobin desaturations (nadir during sleep 81%) from a mean of 96%). There were 0 occurrences of Cheyne Stokes breathing. OXIMETRY: Total sleep time spent at, or below 88% was 13.6 minutes, or 3.6% of total sleep time. Snoring was classified as improved with CPAP. BODY POSITION: Duration of total  sleep and percent of total sleep in their respective position is as follows: supine 379 minutes (100.0%), non-supine 0.0 minutes (0.0%); right 00 minutes (0.0%), left 00 minutes (0.0%), and prone 00 minutes (0.0%). Total supine REM sleep time was 111 minutes (100.0% of total REM sleep). LIMB MOVEMENTS: There were 83 periodic limb movements of sleep (13.1/h), of which 1 (0.2/h) were associated with an arousal. Titration comments: The patient was shown different mask options including nasal pillows, nasal mask and fullface mask. She chose and was fitted with a medium Eson 2 nasal mask from [employer]. CPAP was initiated at a pressure of 5 cm and titrated to a final pressure of 10 cm. On the final pressure she achieved a total sleep time of 105.5 minutes, AHI of 0/h, O2 nadir 91% with supine REM sleep achieved. Snoring was significantly reduced. The video and audio analysis did not show any abnormal or unusual behaviors, movements, phonations or vocalizations. The patient did not take in the bathroom breaks. Post study, the patient indicated, that sleep was better than usual. IMPRESSION: 1.?Obstructive sleep apnea (OSA) 2.?Dysfunctions associated with sleep stages or arousal from sleep ? Recommendation: ? 1.?This study demonstrates resolution of the patient's obstructive sleep apnea with CPAP therapy. I will, therefore, start the patient on home CPAP treatment at a pressure of 10 cm via medium Eson 2 nasal mask with heated humidity. The patient will be advised to be fully compliant with PAP therapy to improve sleep related symptoms and decrease long term cardiovascular risks. The patient should be reminded, that it may take up to 3 months to get fully used to using PAP with all planned sleep. The earlier full compliance is achieved, the better long term compliance tends to be. Please note that untreated obstructive sleep apnea may carry additional perioperative morbidity. Patients with significant  obstructive sleep apnea should receive perioperative PAP therapy and the surgeons and particularly the anesthesiologist should be informed of the diagnosis and the severity of the sleep disordered breathing. 2.?This study shows mild sleep fragmentation and otherwise a good sleep architecture.  3.?The patient should be cautioned not to drive, work at heights, or operate dangerous or heavy equipment when tired or sleepy. Review and reiteration of good sleep hygiene measures should be pursued with any patient. 4.?The patient will be seen in follow-up in the sleep clinic at [facility] for discussion of the test results, symptom and treatment compliance review, further management strategies, etc. The patient and her referring provider will be notified of the test results. ? I certify that I have reviewed the entire raw data recording prior to the issuance of this report in accordance with the Standards of Accreditation of the American Academy of Sleep Medicine (AASM). Huston Foley, MD, PhD [facility]) Diplomat, American Board of Psychiatry and Neurology (Board certified in Neurology and Sleep medicine)             Technical report:  [facility] CPAP Summary    General Information  Name: [name], [name] BMI: 29.45 Physician: Huston Foley, MD  ID: [id number] Height: 62.0 in Technician: Domingo Cocking, RPSGT  Sex: Female Weight: 161.0  lb Record: [id number]  Age: [age] [1961/05/29] Date: 08/11/2022     Medical & Medication History    Mrs. [name], a 61 year old woman with a history of hypothyroidism, anxiety, syncope and episodes of confusion, hyperlipidemia, and overweight state, who reports snoring and excessive daytime somnolence. Her Epworth sleepiness score is 16 out of 24, fatigue severity score is 61 out of 63. Height: 62 in Weight: 161 lb (BMI 29) Neck Size: 15 in Diagnostic polysomnogram performed on 07/27/2022 revealed: Moderate to  severe obstructive sleep apnea, with a total AHI of 29.4/hour, REM AHI of 53.7/hour, supine AHI of 31.1/hour and O2 nadir of 82%.  Epinephrine, Vivelle DOT, Prozac, Synthroid, Cytomel, Naltrexone HCL, Nitrostat, DHEA, Prometrium, Testosterone   Sleep Disorder      Comments   Patient arrived for a CPAP titration polysomnogram. Procedure explained and all questions answered. Patient was shown CPAP at 5 cm/H2O with a medium P-10 nasal pillows mask, a medium Eson 2 nasal mask, and a small Vitera full face mask prior to setup. Standard paste setup without complications. CPAP was started at 5 cm/H2O using patient choice of mask, the medium Eson 2 nasal mask. CPAP pressure increased to 10 cm/H2O in an effort to control obstructive respiratory events and abolish snoring. No obvious cardiac arrhythmias noted. Patient slept supine. Mild PLMS without significant sleep disruption was observed. Patient had no nocturia during this night.    CPAP start time: 10:04:03 PM CPAP end time: 05:01:06 AM   Time Total Supine Side Prone Upright  Recording (TRT) 6h 57.1874m 6h 57.8274m 0h 0.3474m 0h 0.1974m 0h 0.6474m  Sleep (TST) 6h 19.7942m 6h 19.5842m 0h 0.7574m 0h 0.3674m 0h 0.1774m   Latency N1 N2 N3 REM Onset Per. Slp. Eff.  Actual 0h 0.5674m 0h 2.2474m 0h 14.4074m 1h 11.5242m 0h 8.6442m 0h 8.6442m 91.01%   Stg Dur Wake N1 N2 N3 REM  Total 37.5 10.0 199.0 59.0 111.5  Supine 37.5 10.0 199.0 59.0 111.5  Side 0.0 0.0 0.0 0.0 0.0  Prone 0.0 0.0 0.0 0.0 0.0  Upright 0.0 0.0 0.0 0.0 0.0   Stg % Wake N1 N2 N3 REM  Total 9.0 2.6 52.4 15.5 29.4  Supine 9.0 2.6 52.4 15.5 29.4  Side 0.0 0.0 0.0 0.0 0.0  Prone 0.0 0.0 0.0 0.0 0.0  Upright 0.0 0.0 0.0 0.0 0.0     Apnea Summary Sub Supine Side Prone Upright  Total 2 Total 2 2 0 0 0    REM 2 2 0 0 0    NREM 0 0 0 0 0  Obs 2 REM 2 2 0 0 0    NREM 0 0 0 0 0  Mix 0 REM 0 0 0 0 0    NREM 0 0 0 0 0  Cen 0 REM 0 0 0 0 0    NREM 0 0 0 0 0   Rera Summary Sub Supine Side Prone Upright  Total 0 Total 0 0 0 0 0     REM 0 0 0 0 0    NREM 0 0 0 0 0   Hypopnea Summary Sub Supine Side Prone Upright  Total 2 Total 2 2 0 0 0    REM 2 2 0 0 0    NREM 0 0 0 0 0   4% Hypopnea Summary Sub Supine Side Prone Upright  Total (4%) 2 Total 2 2 0 0 0    REM 2 2 0 0 0    NREM 0 0 0  0 0     AHI Total Obs Mix Cen  0.63 Apnea 0.32 0.32 0.00 0.00   Hypopnea 0.32 -- -- --  0.63 Hypopnea (4%) 0.32 -- -- --    Total Supine Side Prone Upright  Position AHI 0.63 0.63 0.00 0.00 0.00  REM AHI 2.15   NREM AHI 0.00   Position RDI 0.63 0.63 0.00 0.00 0.00  REM RDI 2.15   NREM RDI 0.00    4% Hypopnea Total Supine Side Prone Upright  Position AHI (4%) 0.63 0.63 0.00 0.00 0.00  REM AHI (4%) 2.15   NREM AHI (4%) 0.00   Position RDI (4%) 0.63 0.63 0.00 0.00 0.00  REM RDI (4%) 2.15   NREM RDI (4%) 0.00    Desaturation Information  <100% <90% <80% <70% <60% <50% <40%  Supine 12 3 0 0 0 0 0  Side 0 0 0 0 0 0 0  Prone 0 0 0 0 0 0 0  Upright 0 0 0 0 0 0 0  Total 12 3 0 0 0 0 0  Desaturation threshold setting: 4% Minimum desaturation setting: 10 seconds SaO2 nadir: 81% The longest event was a 34 sec obstructive Apnea with a minimum SaO2 of 89%. The lowest SaO2 was 88% associated with a 25 sec obstructive Hypopnea. EKG Rates EKG Avg Max Min  Awake 59 87 47  Asleep 58 85 49  EKG Events: N/A Awakening/Arousal Information # of Awakenings 23  Wake after sleep onset 29.58m  Wake after persistent sleep 29.68m   Arousal Assoc. Arousals Index  Apneas 1 0.2  Hypopneas 1 0.2  Leg Movements 6 0.9  Snore 0.0 0.0  PTT Arousals 0 0.0  Spontaneous 34 5.4  Total 42 6.6  Myoclonus Information PLMS LMs Index  Total LMs during PLMS 83 13.1  LMs w/ Microarousals 1 0.2   LM LMs Index  w/ Microarousal 5 0.8  w/ Awakening 7 1.1  w/ Resp Event 0 0.0  Spontaneous 27 4.3  Total 32 5.1      Titration Table:   [facility] CPAP/Bilevel Report    General Information  Name: [name], [name] BMI: 18 Physician: ,   ID: [id number] Height: 33 in Technician: Gaylyn Cheers  Sex: Female Weight: 161 lb Record: [id number]  Age: 77 [Aug 06, 1961] Date: 08/11/2022 Scorer: Gaylyn Cheers   Recommended Settings IPAP: N/A cmH20 EPAP: N/A cmH2O AHI: N/A AHI (4%): N/A   Pressure IPAP/EPAP 00 05 06 07 09 10   O2 Vol 0.0 0.0 0.0 0.0 0.0 0.0  Time TRT 0.67m 27.25m 20.30m 34.68m 219.67m 115.55m   TST 0.37m 19.19m 16.90m 33.70m 205.8m 105.62m  Sleep Stage % Wake 0.0 30.9 17.5 2.9 6.6 8.7   % REM 0.0 0.0 0.0 6.0 33.2 39.3   % N1 0.0 10.5 12.1 0.0 1.7 2.4   % N2 0.0 63.2 75.8 23.9 56.3 48.3   % N3 0.0 26.3 12.1 70.1 8.8 10.0  Respiratory Total Events 0 0 0 2 2 0   Obs. Apn. 0 0 0 2 0 0   Mixed Apn. 0 0 0 0 0 0   Cen. Apn. 0 0 0 0 0 0   Hypopneas 0 0 0 0 2 0   AHI 0.00 0.00 0.00 3.58 0.59 0.00   Supine AHI 0.00 0.00 0.00 3.58 0.59 0.00   Prone AHI 0.00 0.00 0.00 0.00 0.00 0.00   Side AHI 0.00 0.00 0.00 0.00 0.00 0.00  Respiratory (4%)  Hypopneas (4%) 0.00 0.00 0.00 0.00 2.00 0.00   AHI (4%) 0.00 0.00 0.00 3.58 0.59 0.00   Supine AHI (4%) 0.00 0.00 0.00 3.58 0.59 0.00   Prone AHI (4%) 0.00 0.00 0.00 0.00 0.00 0.00   Side AHI (4%) 0.00 0.00 0.00 0.00 0.00 0.00  Desat Profile <= 90% 0.34m 0.24m 0.20m 0.72m 18.29m 0.68m   <= 80% 0.63m 0.19m 0.16m 0.12m 0.70m 0.66m   <= 70% 0.51m 0.43m 0.57m 0.75m 0.73m 0.57m   <= 60% 0.28m 0.16m 0.37m 0.32m 0.26m 0.38m  Arousal Index Apnea 0.0 0.0 0.0 1.8 0.0 0.0   Hypopnea 0.0 0.0 0.0 0.0 0.3 0.0   LM 0.0 0.0 0.0 0.0 1.2 1.1   Spontaneous 0.0 0.0 14.5 7.2 3.8 7.4

## 2022-08-18 ENCOUNTER — Telehealth: Payer: Self-pay | Admitting: *Deleted

## 2022-08-18 NOTE — Telephone Encounter (Signed)
-----   Message from Star Age, MD sent at 08/17/2022  7:09 PM EDT ----- Patient had a CPAP titration study on 08/11/22, PSG on 07/27/22 and referred for evaluation by Frann Rider, NP, seen by me on 06/29/2022.  Please call and inform patient that I have entered an order for treatment with positive airway pressure (PAP) treatment for obstructive sleep apnea (OSA). She did well during the latest sleep study with CPAP. We will, therefore, arrange for a machine for home use through a DME (durable medical equipment) company of Her choice; and I will see the patient back in follow-up in about 10 weeks. Please also explain to the patient that I will be looking out for compliance data, which can be downloaded from the machine (stored on an SD card, that is inserted in the machine) or via remote access through a modem, that is built into the machine. At the time of the followup appointment we will discuss sleep study results and how it is going with PAP treatment at home. Please advise patient to bring Her machine at the time of the first FU visit, even though this is cumbersome. Bringing the machine for every visit after that will likely not be needed, but often helps for the first visit to troubleshoot if needed. Please re-enforce the importance of compliance with treatment and the need for [place] to monitor compliance data - often an insurance requirement and actually good feedback for the patient as far as how they are doing.  Also remind patient, that any interim PAP machine or mask issues should be first addressed with the DME company, as they can often help better with technical and mask fit issues. Please ask if patient has a preference regarding DME company.  Please also make sure, the patient has a follow-up appointment with me in about 10 weeks from the setup date, thanks. May see one of our nurse practitioners if needed for proper timing of the FU appointment.  Please fax or rout report to the referring  provider. Thanks,   Star Age, MD, PhD [facility])

## 2022-08-18 NOTE — Telephone Encounter (Signed)
Patient called the office and we discussed her sleep study results.  She is very enthusiastic to start CPAP therapy.  She will be in touch with [employer], per her choice, as a DME company.  We discussed the insurance compliance requirements which includes using the machine at least 4 hours every night as well as being seen in the office between 30 and 90 days after set up.  Patient scheduled an initial follow-up appointment for Monday, December 18 at 9:45 AM.  She will arrive 15 to 30 minutes early with her machine and power cord.  Her questions were answered.  CPAP referral has been faxed over to [facility]. Received a receipt of confirmation.

## 2022-11-01 ENCOUNTER — Ambulatory Visit: Payer: BC Managed Care – PPO | Admitting: Neurology

## 2022-11-01 ENCOUNTER — Encounter: Payer: Self-pay | Admitting: Neurology

## 2022-11-01 VITALS — BP 119/78 | HR 76 | Ht 62.0 in

## 2022-11-01 DIAGNOSIS — G4733 Obstructive sleep apnea (adult) (pediatric): Secondary | ICD-10-CM

## 2022-11-01 DIAGNOSIS — Z789 Other specified health status: Secondary | ICD-10-CM

## 2022-11-01 NOTE — Progress Notes (Deleted)
Subjective:    Patient ID: [name] is a 61 y.o. female.  HPI {Common ambulatory SmartLinks:[id number]}  Review of Systems  Neurological:        Initial cpap follow up.  ESS 6.  Doing well,  breaking out from mask.  Try different mask?     Objective:  Neurological Exam  Physical Exam  Assessment:   ***  Plan:   ***

## 2022-11-01 NOTE — Progress Notes (Signed)
Subjective:    Patient ID: [name] is a 61 y.o. female.  HPI    Interim history:   [name] is a 61 year old right-handed woman with an underlying medical history of thyroidism, anxiety, syncope and episodes of confusion (for which she saw my colleagues, Dr. Krista Blue and Frann Rider, NP, recently), hyperlipidemia, and overweight state, who presents for follow-up consultation of her obstructive sleep apnea after interim testing and starting CPAP therapy.  The patient is unaccompanied today.  I first met her at the request of her primary care physician on 06/29/2022, at which time she reported snoring and excessive daytime somnolence.  She was advised to proceed with a sleep study.  She had a baseline sleep study, followed by a CPAP titration study.  Her baseline sleep study from 07/27/2022 showed a sleep efficiency of 87.7%.  She had a sleep latency of 9 minutes, REM latency of 114.5 minutes.  She had a markedly increased percentage of stage II sleep and a reduced percentage of REM sleep.  Total AHI was 18.6/h, by 4% desaturation criteria for hypopneas.  Her O2 nadir was 82%.  She was advised to proceed with a titration study.  She had this on 08/11/2022.  She was titrated from a pressure of 5 cm to a final pressure of 10 cm at which time she had an AHI of 0/h, O2 nadir 91% with supine REM sleep achieved.  Based on her test results I prescribed home CPAP therapy at a pressure of 10 cm.  Sleep efficiency was 91%, sleep latency 8.5 minutes, REM percentage increased at 29.4%.  Her set up date was 09/09/2022.  She has a ResMed air sense 10 AutoSet machine.  Today, 11/01/2022: I reviewed her CPAP compliance data from 09/28/2022 through 10/27/2022 which is a total of 30 days, during which time she used her machine 29 days with percent use days greater than 4 hours at 97%, indicating excellent compliance with an average usage of 7 hours and 5 minutes, residual AHI at goal at 1.4/h, leak acceptable with  the 95th percentile at 7.1 L/min on a pressure of 10 cm with EPR of 3.  She reports doing well with her new machine.  She has adjusted well to treatment, uses a nasal mask but has had some acne outbreak around the mask rim, no actual rash, she does not believe she has an allergy to the silicone.  She would like to use an under the nose style mask of possible.  She has noticed improvement in her daytime somnolence, her snoring, her sleep consolidation and sleep quality and does not feel if she wants to go without treatment.  She has a backup machine from a friend in case she has to travel unexpectedly.  The friend no longer needs the machine.  The patient's allergies, current medications, family history, past medical history, past social history, past surgical history and problem list were reviewed and updated as appropriate.   Previously:    06/29/22: (She) reports snoring and excessive daytime somnolence.  I reviewed your office note from 04/22/2022.  Her Epworth sleepiness score is 16 out of 24, fatigue severity score is 61 out of 63.  She reports a family history of sleep apnea affecting her father who is currently not on a CPAP machine.  She is retired from [employer], she works as a Contractor now.  She has experienced sleepiness at the wheel but gives herself extra time to pull over and take a nap if  needed.  She has not had a car accident from falling asleep at the wheel but does get sleepy for her longer distance drives, she does have to travel by road and by air for her work.  She lives with her husband and has 3 grown children, in [place], [place] and [place].  She has a history of SVT and has seen cardiology.  She drinks caffeine in the form of tea, 2 cups/day, alcohol 1 glass of wine on a Saturday typically, she is a non-smoker.  She has nocturia less than once per night, she has woken up with a headache, she has gained weight in the past 2 years in the realm of 30 pounds.  She has  been noted to have pauses in her breathing while asleep as reported by her husband and also a friend when they shared a room during a cruise recently.  She does not watch TV in her bedroom.     Her Past Medical History Is Significant For: Past Medical History:  Diagnosis Date   Allergy to alpha-gal    Cataract    Epstein Barr virus infection    Heart attack ([place])    Idiopathic anaphylactic reaction    Lyme disease     Her Past Surgical History Is Significant For: Past Surgical History:  Procedure Laterality Date   ABDOMINAL HYSTERECTOMY     CATARACT EXTRACTION W/PHACO Left 12/24/2013   Procedure: CATARACT EXTRACTION PHACO AND INTRAOCULAR LENS PLACEMENT ([place]);  Surgeon: Tonny Branch, MD;  Location: [facility];  Service: Ophthalmology;  Laterality: Left;  CDE:  10.84   CATARACT EXTRACTION W/PHACO Right 01/07/2014   Procedure: CATARACT EXTRACTION PHACO AND INTRAOCULAR LENS PLACEMENT ([facility]);  Surgeon: Tonny Branch, MD;  Location: [facility];  Service: Ophthalmology;  Laterality: Right;  CDE:7.76   TUBAL LIGATION      Her Family History Is Significant For: Family History  Problem Relation Age of Onset   Graves' disease Mother    CAD Father    Sleep apnea Neg Hx     Her Social History Is Significant For: Social History   Socioeconomic History   Marital status: Married    Spouse name: Not on file   Number of children: Not on file   Years of education: Not on file   Highest education level: Not on file  Occupational History   Not on file  Tobacco Use   Smoking status: Never   Smokeless tobacco: Never  Vaping Use   Vaping Use: Never used  Substance and Sexual Activity   Alcohol use: Yes    Alcohol/week: 1.0 standard drink of alcohol    Types: 1 Glasses of wine per week    Comment: wine occasionally   Drug use: No   Sexual activity: Not on file  Other Topics Concern   Not on file  Social History Narrative   Not on file   Social Determinants of Health   Financial Resource Strain:  Not on file  Food Insecurity: Not on file  Transportation Needs: Not on file  Physical Activity: Not on file  Stress: Not on file  Social Connections: Not on file    Her Allergies Are:  Allergies  Allergen Reactions   Cheese     12/03/2021 states she has never had an allergy to cheese   Mango Flavor Anaphylaxis   Meat Extract     Alpha-Gal Syndrome   Meperidine Hcl Other (See Comments)    SYNCOPE   Zithromax [Azithromycin]  Zofran [Ondansetron] Other (See Comments)    Prolonged QT  :   Her Current Medications Are:  Outpatient Encounter Medications as of 11/01/2022  Medication Sig   EPINEPHrine 0.15 MG/0.15ML IJ injection Inject 0.15 mg into the muscle as needed for anaphylaxis.   estradiol (VIVELLE-DOT) 0.075 MG/24HR Place 1 patch onto the skin 2 (two) times a week.   FLUoxetine (PROZAC) 10 MG tablet Take 1 tablet (10 mg total) by mouth daily.   levothyroxine (SYNTHROID) 50 MCG tablet TAKE 1 TABLET BY MOUTH ONCE A DAY.   liothyronine (CYTOMEL) 5 MCG tablet Take 5 mcg by mouth every morning.   NALTREXONE HCL PO Take 4 mg by mouth at bedtime.   nitroGLYCERIN (NITROSTAT) 0.4 MG SL tablet Place 1 tablet (0.4 mg total) under the tongue every 5 (five) minutes as needed for chest pain.   Nutritional Supplements (DHEA PO) Take 1 tablet by mouth daily.   progesterone (PROMETRIUM) 100 MG capsule Take by mouth.   TESTOSTERONE COMPOUNDING KIT 20 % CREA Place onto the skin. nightly   [DISCONTINUED] amoxicillin-clavulanate (AUGMENTIN) 875-125 MG tablet Take 1 tablet by mouth 2 (two) times daily. Take all of this medication (Patient not taking: Reported on 11/01/2022)   [DISCONTINUED] HYDROcodone bit-homatropine (HYCODAN) 5-1.5 MG/5ML syrup Take 5 mLs by mouth every 8 (eight) hours as needed for cough. (Patient not taking: Reported on 11/01/2022)   No facility-administered encounter medications on file as of 11/01/2022.  :  Review of Systems:  Out of a complete 14 point review of  systems, all are reviewed and negative with the exception of these symptoms as listed below:  Review of Systems  Neurological:          Initial cpap follow up.  ESS 6.  Doing well,  breaking out from mask.  Asking to try different mask when able to get one.  She also states that she is using friends PAP that is set to her setting when she travels and needs to use a machine (so that is the gaps  in her DL).      Objective:  Neurological Exam  Physical Exam Physical Examination:   Vitals:   11/01/22 1054  BP: 119/78  Pulse: 76    General Examination: The patient is a very pleasant 61 y.o. female in no acute distress. She appears well-developed and well-nourished and well groomed.   HEENT: Normocephalic, atraumatic, pupils are equal, round and reactive to light, extraocular tracking is well-preserved, she has a couple of red spots around the nasal area, almost like small pimples.  Hearing is grossly intact. Face is symmetric with normal facial animation. Speech is clear with no dysarthria noted. There is no hypophonia. There is no lip, neck/head, jaw or voice tremor. Neck is supple with full range of passive and active motion. There are no carotid bruits on auscultation. Oropharynx exam reveals: mild mouth dryness, good dental hygiene and moderate airway crowding. Tongue protrudes centrally, uvula and tonsils are not fully visualized.     Chest: Clear to auscultation without wheezing, rhonchi or crackles noted.   Heart: S1+S2+0, regular and normal without murmurs, rubs or gallops noted.    Abdomen: Soft, non-tender and non-distended.   Extremities: There is no obvious edema in the distal lower extremities bilaterally.    Skin: Warm and dry without trophic changes noted.    Musculoskeletal: exam reveals no obvious joint deformities.    Neurologically:  Mental status: The patient is awake, alert and oriented in all 4 spheres. Her  immediate and remote memory, attention, language skills  and fund of knowledge are appropriate. There is no evidence of aphasia, agnosia, apraxia or anomia. Speech is clear with normal prosody and enunciation. Thought process is linear. Mood is normal and affect is normal.  Cranial nerves II - XII are as described above under HEENT exam.  Motor exam: Normal bulk, strength and tone is noted. There is no obvious tremor.  Fine motor skills and coordination: grossly intact.  Cerebellar testing: No dysmetria or intention tremor. There is no truncal or gait ataxia.  Sensory exam: intact to light touch in the upper and lower extremities.  Gait, station and balance: She stands easily. No veering to one side is noted. No leaning to one side is noted. Posture is age-appropriate and stance is narrow based. Gait shows normal stride length and normal pace. No problems turning are noted.    Assessment and Plan:  In summary, DENECIA BRUNETTE is a very pleasant 61 year old right-handed woman with an underlying medical history of thyroidism, anxiety, syncope and episodes of confusion (saw my colleagues, Dr. Krista Blue and Frann Rider, NP), hyperlipidemia, and overweight state, who presents for follow-up consultation of her obstructive sleep apnea after interim testing and starting CPAP therapy. Her baseline sleep study from 07/27/2022 showed a moderate to severe obstructive sleep apnea.  She had a subsequent titration study on 08/11/2022 and did well with CPAP of 10 cm.  She is compliant with treatment, she has greatly benefited from CPAP therapy, and is very motivated to continue with treatment.  She has had some issues with the nasal mask which is the Fisher-Paykel Eson nasal mask.  I provided a Fisher-Paykel under the nose style cushion mask called Evora nasal in a sample pack.  If she likes this she can reorder through her DME provider which is [facility].  She is commended for treatment adherence and advised to follow-up routinely in this clinic in 1 year.  I answered all  her questions today and she was in agreement.   I spent 35 minutes in total face-to-face time and in reviewing records during pre-charting, more than 50% of which was spent in counseling and coordination of care, reviewing test results, reviewing medications and treatment regimen and/or in discussing or reviewing the diagnosis of OSA, the prognosis and treatment options. Pertinent laboratory and imaging test results that were available during this visit with the patient were reviewed by me and considered in my medical decision making (see chart for details).

## 2022-11-01 NOTE — Patient Instructions (Signed)

## 2023-01-18 IMAGING — DX DG FOOT COMPLETE 3+V*R*
3 series · 3 of 3 positions shown · non-contrast
Comparison: None.

CLINICAL DATA: Acute RIGHT foot pain following injury today.
Initial encounter.

EXAM:
RIGHT FOOT COMPLETE - 3+ VIEW

[foot ap]
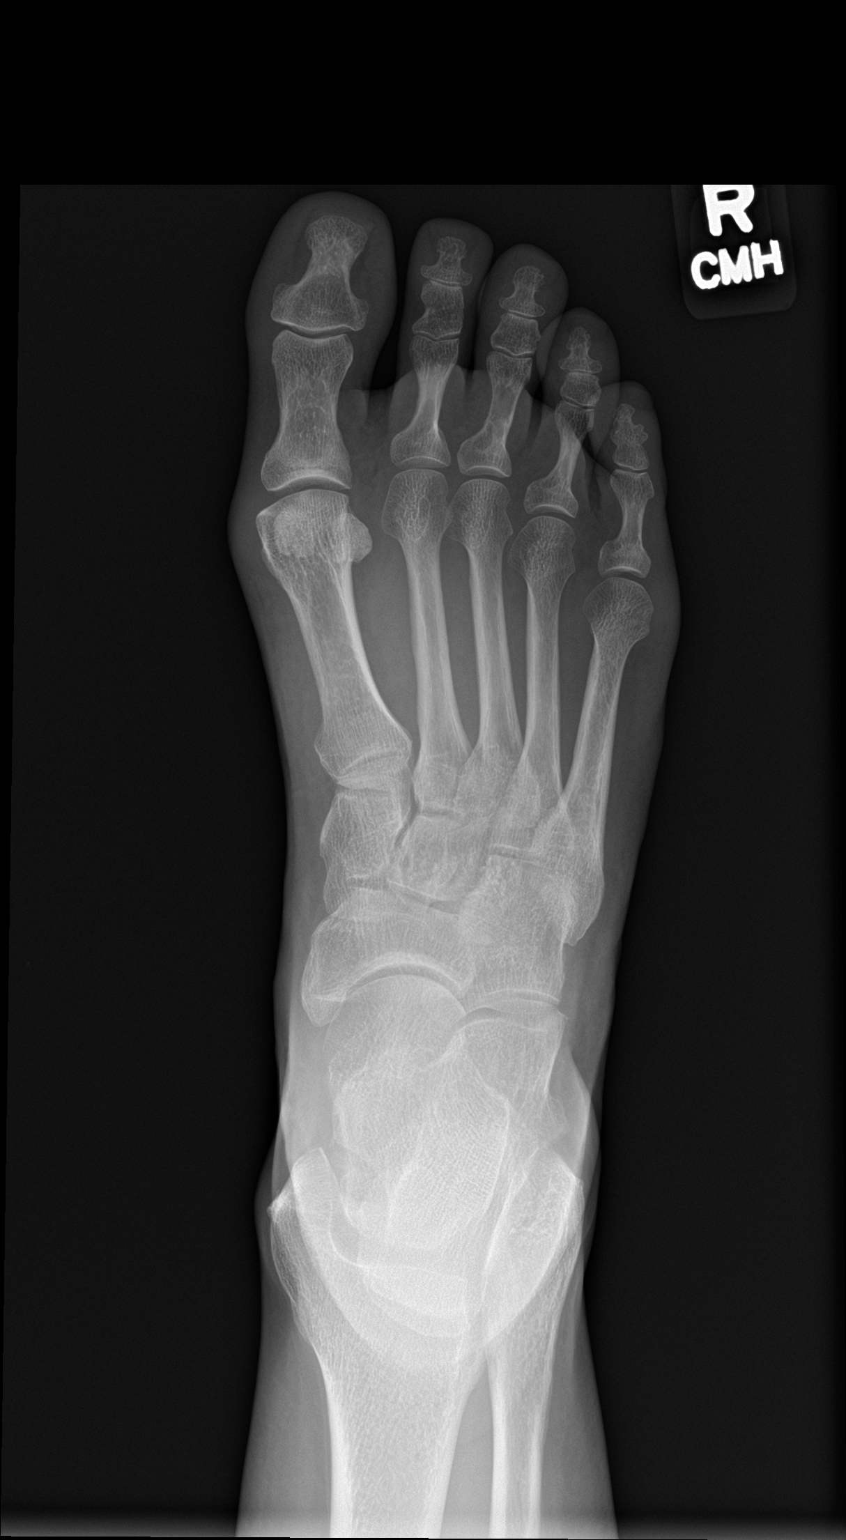

[foot obl]
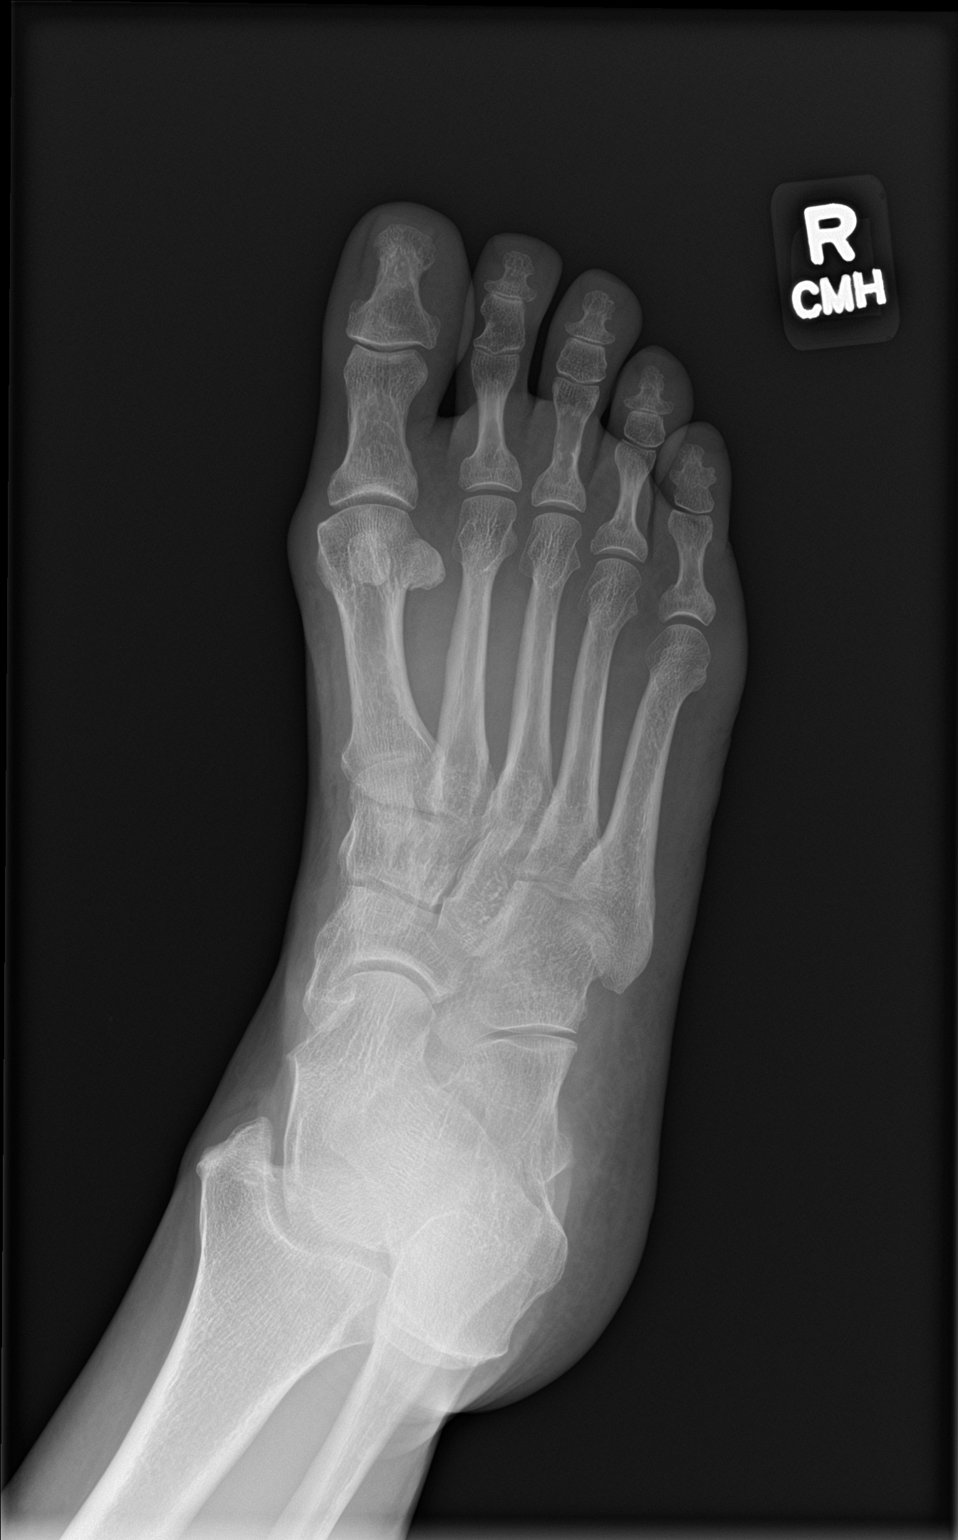

[foot lat]
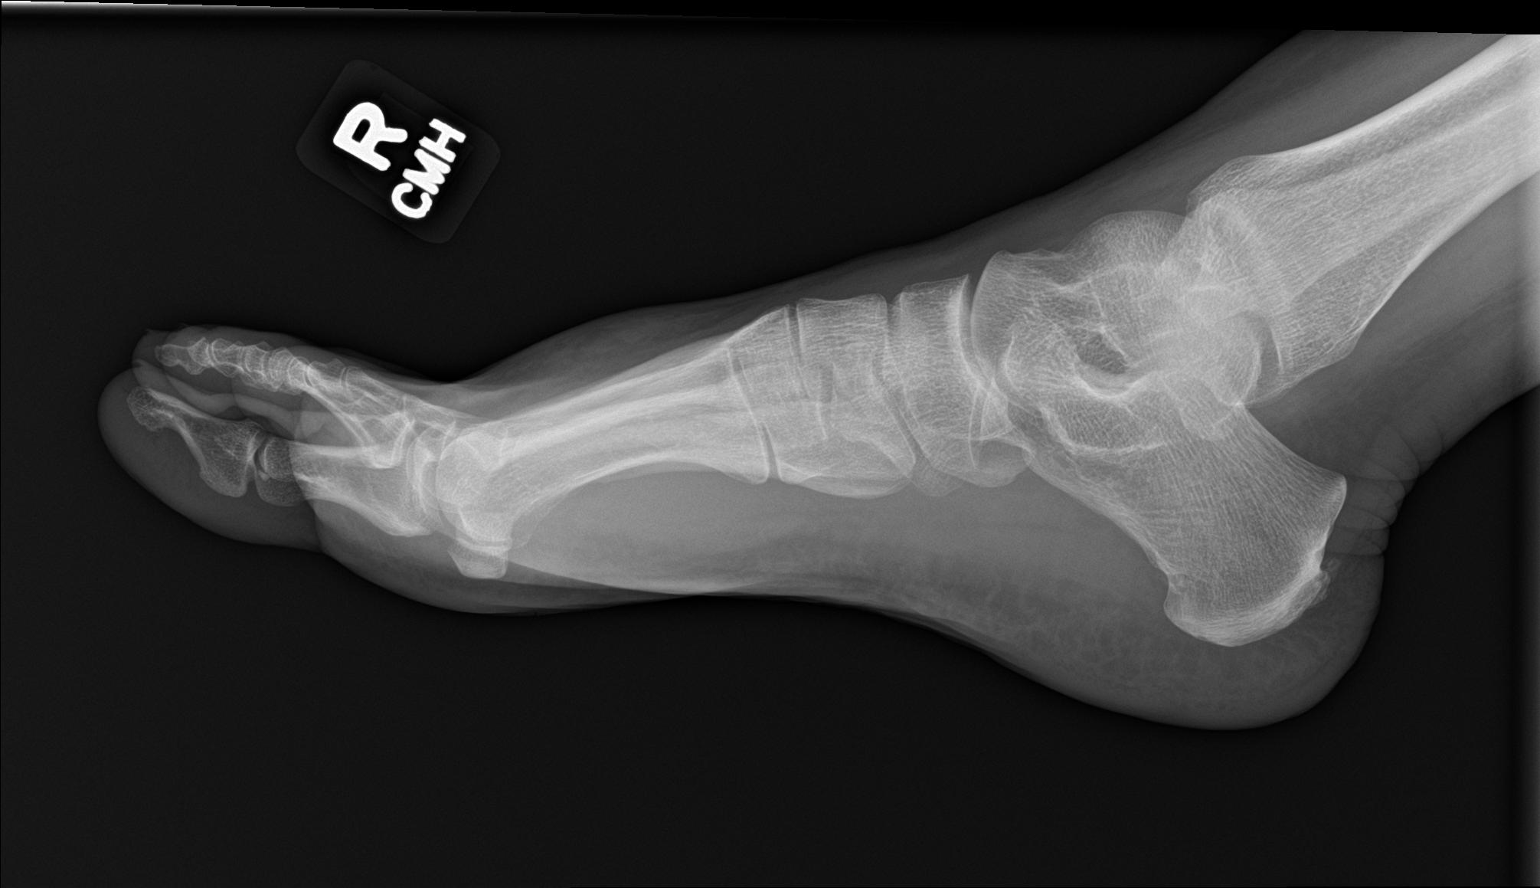

[3 of 3 positions shown; findings below may reference images not displayed]

FINDINGS: There is no evidence of acute fracture, subluxation or dislocation.

Mild dorsal soft tissue swelling is noted.

No other significant abnormalities noted.
IMPRESSION: Mild dorsal soft tissue swelling. No acute bony abnormality.

## 2023-06-22 ENCOUNTER — Encounter: Payer: Self-pay | Admitting: Nurse Practitioner

## 2023-06-22 ENCOUNTER — Ambulatory Visit: Payer: BC Managed Care – PPO | Admitting: Nurse Practitioner

## 2023-06-22 VITALS — BP 126/76 | HR 73 | Temp 97.9°F | Resp 20 | Ht 62.0 in | Wt 170.0 lb

## 2023-06-22 DIAGNOSIS — M25512 Pain in left shoulder: Secondary | ICD-10-CM | POA: Diagnosis not present

## 2023-06-22 MED ORDER — BUPIVACAINE HCL 0.25 % IJ SOLN
1.0000 mL | Freq: Once | INTRAMUSCULAR | Status: AC
  Administered 2023-06-22: 1 mL via INTRA_ARTICULAR

## 2023-06-22 MED ORDER — METHYLPREDNISOLONE ACETATE 40 MG/ML IJ SUSP
40.0000 mg | Freq: Once | INTRAMUSCULAR | Status: AC
  Administered 2023-06-22: 40 mg via INTRAMUSCULAR

## 2023-06-22 NOTE — Progress Notes (Signed)
Subjective:    Patient ID: [name], female    DOB: 1961-01-30, 62 y.o.   MRN: [id number]   Chief Complaint: Shoulder Pain   Shoulder Pain  The pain is present in the left shoulder. This is a new problem. The current episode started 1 to 4 weeks ago. The problem occurs intermittently. The problem has been gradually worsening. The quality of the pain is described as sharp. The pain is at a severity of 8/10 (when moving). Associated symptoms include a limited range of motion. She has tried acetaminophen (gabapentin) for the symptoms.     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  Eyes:  Negative for pain.  Respiratory:  Negative for shortness of breath.   Cardiovascular:  Negative for chest pain, palpitations and leg swelling.  Gastrointestinal:  Negative for abdominal pain.  Endocrine: Negative for polydipsia.  Musculoskeletal:  Positive for arthralgias (left shoulder).  Skin:  Negative for rash.  Neurological:  Negative for dizziness, weakness and headaches.  Hematological:  Does not bruise/bleed easily.  All other systems reviewed and are negative.      Objective:   Physical Exam Constitutional:      Appearance: Normal appearance. She is obese.  Cardiovascular:     Rate and Rhythm: Normal rate and regular rhythm.     Heart sounds: Normal heart sounds.  Pulmonary:     Effort: Pulmonary effort is normal.     Breath sounds: Normal breath sounds.  Musculoskeletal:     Comments: Decrease ROM of left shoulder with limited abduction  Skin:    General: Skin is warm.  Neurological:     General: No focal deficit present.     Mental Status: She is alert and  oriented to person, place, and time.  Psychiatric:        Mood and Affect: Mood normal.        Behavior: Behavior normal.        Thought Content: Thought content normal.    BP 126/76   Pulse 73   Temp 97.9 F (36.6 C) (Temporal)   Resp 20   Ht 5\' 2"  (1.575 m)   Wt 170 lb (77.1 kg)   SpO2 94%   BMI 31.09 kg/m   Joint Injection/Arthrocentesis  Date/Time: 06/22/2023 3:53 PM  Performed by: Bennie Pierini, FNP Authorized by: Daphine Deutscher [name], FNP  Indications: pain  Body area: shoulder Joint: left shoulder Local anesthesia used: no  Anesthesia: Local anesthesia used: no  Sedation: Patient sedated: no  Preparation: Patient was prepped and draped in the usual sterile fashion. Needle size: 22 G Ultrasound guidance: no Approach: posterior Methylprednisolone amount: 40 mg Bupivacaine 0.25% amount: 1 mL Patient tolerance: patient tolerated the procedure well with no immediate complications          Assessment & Plan:  [name] in today with chief complaint of Shoulder Pain (Left shoulder/)   1. Acute pain of left shoulder Ice Rest  RTO prn - methylPREDNISolone acetate (DEPO-MEDROL) injection 40 mg - bupivacaine (MARCAINE) 0.25 % (with pres) injection 1 mL    The above assessment and management plan was discussed with the patient. The patient verbalized understanding of and has agreed to  the management plan. Patient is aware to call the clinic if symptoms persist or worsen. Patient is aware when to return to the clinic for a follow-up visit. Patient educated on when it is appropriate to go to the emergency department.   [name] Daphine Deutscher, FNP

## 2023-06-22 NOTE — Patient Instructions (Signed)
Joint Steroid Injection A joint steroid injection is a procedure to relieve swelling and pain in a joint. Steroids are medicines that reduce inflammation. In this procedure, your health care provider uses a syringe and a needle to inject a steroid medicine into a painful and inflamed joint. A pain-relieving medicine (anesthetic) may be injected along with the steroid. In some cases, your health care provider may use an imaging technique such as ultrasound or fluoroscopy to guide the injection. Joints that are often treated with steroid injections include the knee, shoulder, hip, and spine. These injections may also be used in the elbow, ankle, and joints of the hands or feet. You may have joint steroid injections as part of your treatment for inflammation caused by: Gout. Rheumatoid arthritis. Advanced wear-and-tear arthritis (osteoarthritis). Tendinitis. Bursitis. Joint steroid injections may be repeated, but having them too often can damage a joint or the skin over the joint. You should not have joint steroid injections less than 6 weeks apart or more than four times a year. Tell a health care provider about: Any allergies you have. All medicines you are taking, including vitamins, herbs, eye drops, creams, and over-the-counter medicines. Any problems you or family members have had with anesthetic medicines. Any blood disorders you have. Any surgeries you have had. Any medical conditions you have. Whether you are pregnant or may be pregnant. What are the risks? Generally, this is a safe treatment. However, problems may occur, including: Infection. Bleeding. Allergic reactions to medicines. Damage to the joint or tissues around the joint. Thinning of skin or loss of skin color over the joint. Temporary flushing of the face or chest. Temporary increase in pain. Temporary increase in blood sugar. Failure to relieve inflammation or pain. What happens before the treatment? Medicines Ask  your health care provider about: Changing or stopping your regular medicines. This is especially important if you are taking diabetes medicines or blood thinners. Taking medicines such as aspirin and ibuprofen. These medicines can thin your blood. Do not take these medicines unless your health care provider tells you to take them. Taking over-the-counter medicines, vitamins, herbs, and supplements. General instructions You may have imaging tests of your joint. Ask your health care provider if you can drive yourself home after the procedure. What happens during the treatment?  Your health care provider will position you for the injection and locate the injection site over your joint. The skin over the joint will be cleaned with a germ-killing soap. Your health care provider may: Spray a numbing solution (topical anesthetic) over the injection site. Inject a local anesthetic under the skin above your joint. The needle will be placed through your skin into your joint. Your health care provider may use imaging to guide the needle to the right spot for the injection. If imaging is used, a special contrast dye may be injected to confirm that the needle is in the correct location. The steroid medicine will be injected into your joint. Anesthetic may be injected along with the steroid. This may be a medicine that relieves pain for a short time (short-acting anesthetic) or for a longer time (long-acting anesthetic). The needle will be removed, and an adhesive bandage (dressing) will be placed over the injection site. The procedure may vary among health care providers and hospitals. What can I expect after the treatment? You will be able to go home after the treatment. It is normal to feel slight flushing for a few days after the injection. After the treatment, it is   common to have an increase in joint pain after the anesthetic has worn off. This may happen about an hour after a short-acting anesthetic  or about 8 hours after a longer-acting anesthetic. You should begin to feel relief from joint pain and swelling after 24 to 48 hours. Contact your health care provider if you do not begin to feel relief after 2 days. Follow these instructions at home: Injection site care Leave the adhesive dressing over your injection site in place until your health care provider says you can remove it. Check your injection site every day for signs of infection. Check for: More redness, swelling, or pain. Fluid or blood. Warmth. Pus or a bad smell. Activity Return to your normal activities as told by your health care provider. Ask your health care provider what activities are safe for you. You may be asked to limit activities that put stress on the joint for a few days. Do joint exercises as told by your health care provider. Do not take baths, swim, or use a hot tub until your health care provider approves. Ask your health care provider if you may take showers. You may only be allowed to take sponge baths. Managing pain, stiffness, and swelling  If directed, put ice on the joint. To do this: Put ice in a plastic bag. Place a towel between your skin and the bag. Leave the ice on for 20 minutes, 2-3 times a day. Remove the ice if your skin turns bright red. This is very important. If you cannot feel pain, heat, or cold, you have a greater risk of damage to the area. Raise (elevate) your joint above the level of your heart when you are sitting or lying down. General instructions Take over-the-counter and prescription medicines only as told by your health care provider. Do not use any products that contain nicotine or tobacco, such as cigarettes, e-cigarettes, and chewing tobacco. These can delay joint healing. If you need help quitting, ask your health care provider. If you have diabetes, be aware that your blood sugar may be slightly elevated for several days after the injection. Keep all follow-up visits.  This is important. Contact a health care provider if you have: Chills or a fever. Any signs of infection at your injection site. Increased pain or swelling or no relief after 2 days. Summary A joint steroid injection is a treatment to relieve pain and swelling in a joint. Steroids are medicines that reduce inflammation. Your health care provider may add an anesthetic along with the steroid. You may have joint steroid injections as part of your arthritis treatment. Joint steroid injections may be repeated, but having them too often can damage a joint or the skin over the joint. Contact your health care provider if you have a fever, chills, or signs of infection, or if you get no relief from joint pain or swelling. This information is not intended to replace advice given to you by your health care provider. Make sure you discuss any questions you have with your health care provider. Document Revised: 04/11/2020 Document Reviewed: 04/11/2020 Elsevier Patient Education  2024 [employer]  

## 2023-07-15 ENCOUNTER — Telehealth: Payer: Self-pay | Admitting: Nurse Practitioner

## 2023-07-15 DIAGNOSIS — Z1212 Encounter for screening for malignant neoplasm of rectum: Secondary | ICD-10-CM

## 2023-07-20 ENCOUNTER — Ambulatory Visit: Payer: BC Managed Care – PPO | Admitting: Nurse Practitioner

## 2023-07-20 VITALS — BP 121/71 | HR 58 | Temp 97.6°F | Ht 62.0 in | Wt 169.4 lb

## 2023-07-20 DIAGNOSIS — H66001 Acute suppurative otitis media without spontaneous rupture of ear drum, right ear: Secondary | ICD-10-CM | POA: Insufficient documentation

## 2023-07-20 MED ORDER — AMOXICILLIN 875 MG PO TABS: 875.0000 mg | ORAL_TABLET | Freq: Two times a day (BID) | ORAL | 0 refills | Status: AC

## 2023-07-20 NOTE — Progress Notes (Signed)
Acute Office Visit  Subjective:     Patient ID: [name], female    DOB: 13-Feb-1961, 62 y.o.   MRN: [id number]  Chief Complaint  Patient presents with   Ear Pain    Right ear most painful but both ears hurt and are draining. Drainage from right ear has left rash on pt's face. Has been going on for about a week.    HPI  [name] is a 62 y.o. female seen today as an acute visit for ear pain Symptoms started with 2 week(s) history of pain on both ear with drainage and denies congestion, sore throat, headache, fever, and chills. Temperature normal at home. 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   Allergy to alpha-gal 06/28/2018   Acquired hypothyroidism 02/22/2020   Confusion 12/03/2021   Alteration consciousness 12/03/2021   Non-recurrent acute suppurative otitis media of right ear without spontaneous rupture of tympanic membrane 07/20/2023   Resolved Ambulatory Problems    Diagnosis Date Noted   Shortness of breath 01/15/2011   Atypical chest pain 01/15/2011   Unstable angina (HCC) 07/29/2016   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   Past Medical History:  Diagnosis Date   Cataract    Epstein Barr virus infection    Heart attack (HCC)    Idiopathic anaphylactic reaction    Lyme disease      ROS Negative unless indicated in HPI    Objective:    BP 121/71   Pulse (!) 58   Temp 97.6 F (36.4 C) (Temporal)   Ht 5\' 2"  (1.575 m)   Wt 169 lb 6.4 oz (76.8 kg)   SpO2 99%   BMI 30.98 kg/m  BP Readings from Last 3 Encounters:  07/20/23 121/71  06/22/23 126/76  11/01/22 119/78   Wt Readings from Last 3 Encounters:  07/20/23 169 lb 6.4 oz (76.8 kg)  06/22/23 170 lb (77.1 kg)  07/09/22 163 lb (73.9 kg)      Physical Exam General appearance: alert, well appearing, and in no distress.    Ears: left ear normal, right TM red, dull, bulging Nose: normal and patent, no erythema, discharge or polyps Oropharynx: mucous membranes moist, pharynx normal without lesions Neck: supple, no significant adenopathy Lungs: clear to auscultation, no wheezes, rales or rhonchi, symmetric air entry No results found for any visits on 07/20/23.      Assessment & Plan:  Non-recurrent acute suppurative otitis media of right ear without spontaneous rupture of tympanic membrane -     Amoxicillin; Take 1 tablet (875 mg total) by mouth 2 (two) times daily for 10 days.  Dispense: 20 tablet; Refill: 0   [name] is a 62 yrs old female, no acute distress Otitis Media Augmentin 875 mg BID for 10 days Call or return to clinic prn if these symptoms worsen or fail to improve as anticipated.   The above assessment and management plan was discussed with the patient. The patient verbalized understanding of and has agreed to the management plan. Patient is aware to call the clinic if they develop any new symptoms or if symptoms persist or worsen. Patient is aware when to return to the clinic for a follow-up visit. Patient educated on when it is appropriate to go to the emergency department.  Return if symptoms worsen or fail to improve.  Karle Barr  Ariel Morn, DNP [facility] [address] [place], [place] [address] [phone]

## 2023-08-24 ENCOUNTER — Ambulatory Visit (INDEPENDENT_AMBULATORY_CARE_PROVIDER_SITE_OTHER): Payer: BC Managed Care – PPO | Admitting: Family Medicine

## 2023-08-24 ENCOUNTER — Encounter: Payer: Self-pay | Admitting: Family Medicine

## 2023-08-24 VITALS — BP 122/71 | HR 65 | Temp 98.3°F | Ht 62.0 in | Wt 165.0 lb

## 2023-08-24 DIAGNOSIS — N760 Acute vaginitis: Secondary | ICD-10-CM

## 2023-08-24 DIAGNOSIS — R21 Rash and other nonspecific skin eruption: Secondary | ICD-10-CM

## 2023-08-24 DIAGNOSIS — M79602 Pain in left arm: Secondary | ICD-10-CM | POA: Diagnosis not present

## 2023-08-24 LAB — URINALYSIS, ROUTINE W REFLEX MICROSCOPIC
Bilirubin, UA: NEGATIVE
Glucose, UA: NEGATIVE
Ketones, UA: NEGATIVE
Leukocytes,UA: NEGATIVE
Nitrite, UA: NEGATIVE
Protein,UA: NEGATIVE
RBC, UA: NEGATIVE
Specific Gravity, UA: 1.025 (ref 1.005–1.030)
Urobilinogen, Ur: 0.2 mg/dL (ref 0.2–1.0)
pH, UA: 5.5 (ref 5.0–7.5)

## 2023-08-24 LAB — WET PREP FOR TRICH, YEAST, CLUE
Clue Cell Exam: NEGATIVE
Trichomonas Exam: NEGATIVE
Yeast Exam: NEGATIVE

## 2023-08-24 MED ORDER — TRIAMCINOLONE ACETONIDE 0.025 % EX OINT
1.0000 | TOPICAL_OINTMENT | Freq: Two times a day (BID) | CUTANEOUS | 0 refills | Status: AC
Start: 2023-08-24 — End: ?

## 2023-08-24 MED ORDER — PREDNISONE 20 MG PO TABS
40.0000 mg | ORAL_TABLET | Freq: Every day | ORAL | 0 refills | Status: AC
Start: 2023-08-24 — End: 2023-08-29

## 2023-08-24 NOTE — Progress Notes (Addendum)
Subjective:  Patient ID: [name], female    DOB: [birth year], 62 y.o.   MRN: [id number]  Patient Care Team: Bennie Pierini, FNP as PCP - General (Nurse Practitioner)   Chief Complaint:  Vaginitis (Rash around ears with pain/Referral ortho and PT left shoulder)   HPI: [name] is a 62 y.o. female presenting on 08/24/2023 for Vaginitis (Rash around ears with pain/Referral ortho and PT left shoulder)  HPI Shoulder  States that she is "losing range of motion" and it is making it difficult to get dressed and perform ADLs. States that it started 8 weeks ago. ~6 week ago received steroid injection and that did not help. She has seen massage therapy and chiropractor, neither of which have helped her symptoms. Endorses pain in left arm at the site and waking up with pain and a nodule on left arm. Endorses associated symptoms of numbness and tingling in left lower hand. She is States that she will take medication only if pain is intense. Takes either tylenol or advil. Has tried alternating heat and ice, which did not help.   2. Acute vaginitis States that she is having itching.  Has tried vagisil which helped  Denies fever, low back pain, NVD, burning with urination, urgency, change in odor. Denies rash at the site. Denies risk of STI  3. Rash  States that she was treated with for an ear infection a few weeks ago and then developed rash. States that she has not changed any lotions, shampoos, detergents, or other products. States that it itches and burns. She has not tried any OTC creams. States that her mother has eczema  Relevant past medical, surgical, family, and social history reviewed and updated as indicated.  Allergies and medications reviewed and updated. Data reviewed: Chart in Epic.   Past Medical History:  Diagnosis Date   Allergy to alpha-gal    Cataract    Epstein Barr virus infection    Heart attack (HCC)    Idiopathic anaphylactic reaction    Lyme  disease     Past Surgical History:  Procedure Laterality Date   ABDOMINAL HYSTERECTOMY     CATARACT EXTRACTION W/PHACO Left 12/24/2013   Procedure: CATARACT EXTRACTION PHACO AND INTRAOCULAR LENS PLACEMENT (IOC);  Surgeon: Gemma Payor, MD;  Location: [facility];  Service: Ophthalmology;  Laterality: Left;  CDE:  10.84   CATARACT EXTRACTION W/PHACO Right 01/07/2014   Procedure: CATARACT EXTRACTION PHACO AND INTRAOCULAR LENS PLACEMENT (IOC);  Surgeon: Gemma Payor, MD;  Location: [facility];  Service: Ophthalmology;  Laterality: Right;  CDE:7.76   TUBAL LIGATION      Social History   Socioeconomic History   Marital status: Married    Spouse name: Not on file   Number of children: Not on file   Years of education: Not on file   Highest education level: Professional school degree (e.g., MD, DDS, DVM, JD)  Occupational History   Not on file  Tobacco Use   Smoking status: Never   Smokeless tobacco: Never  Vaping Use   Vaping status: Never Used  Substance and Sexual Activity   Alcohol use: Yes    Alcohol/week: 1.0 standard drink of alcohol    Types: 1 Glasses of wine per week    Comment: wine occasionally   Drug use: No   Sexual activity: Not on file  Other Topics Concern   Not on file  Social History Narrative   Not on file   Social Determinants of Health  Financial Resource Strain: Low Risk  (07/20/2023)   Overall Financial Resource Strain (CARDIA)    Difficulty of Paying Living Expenses: Not very hard  Food Insecurity: No Food Insecurity (07/20/2023)   Hunger Vital Sign    Worried About Running Out of Food in the Last Year: Never true    Ran Out of Food in the Last Year: Never true  Transportation Needs: No Transportation Needs (07/20/2023)   PRAPARE - Administrator, Civil Service (Medical): No    Lack of Transportation (Non-Medical): No  Physical Activity: Insufficiently Active (07/20/2023)   Exercise Vital Sign    Days of Exercise per Week: 6 days    Minutes of Exercise  per Session: 20 min  Stress: No Stress Concern Present (07/20/2023)   [employer] of Occupational Health - Occupational Stress Questionnaire    Feeling of Stress : Only a little  Social Connections: Socially Integrated (07/20/2023)   Social Connection and Isolation Panel [NHANES]    Frequency of Communication with Friends and Family: More than three times a week    Frequency of Social Gatherings with Friends and Family: More than three times a week    Attends Religious Services: More than 4 times per year    Active Member of [employer] or Organizations: Yes    Attends Banker Meetings: More than 4 times per year    Marital Status: Married  Catering manager Violence: Not on file    Outpatient Encounter Medications as of 08/24/2023  Medication Sig   EPINEPHrine 0.15 MG/0.15ML IJ injection Inject 0.15 mg into the muscle as needed for anaphylaxis.   estradiol (VIVELLE-DOT) 0.075 MG/24HR Place 1 patch onto the skin 2 (two) times a week.   FLUoxetine (PROZAC) 10 MG tablet Take 1 tablet (10 mg total) by mouth daily.   levothyroxine (SYNTHROID) 50 MCG tablet TAKE 1 TABLET BY MOUTH ONCE A DAY.   liothyronine (CYTOMEL) 5 MCG tablet Take 5 mcg by mouth every morning.   NALTREXONE HCL PO Take 4 mg by mouth at bedtime.   nitroGLYCERIN (NITROSTAT) 0.4 MG SL tablet Place 1 tablet (0.4 mg total) under the tongue every 5 (five) minutes as needed for chest pain.   Nutritional Supplements (DHEA PO) Take 1 tablet by mouth daily.   progesterone (PROMETRIUM) 100 MG capsule Take 200 mg by mouth.   TESTOSTERONE COMPOUNDING KIT 20 % CREA Place onto the skin. nightly   No facility-administered encounter medications on file as of 08/24/2023.    Allergies  Allergen Reactions   Cheese     12/03/2021 states she has never had an allergy to cheese   Mango Flavor Anaphylaxis   Meat Extract     Alpha-Gal Syndrome   Meperidine Hcl Other (See Comments)    SYNCOPE   Zithromax [Azithromycin]    Zofran  [Ondansetron] Other (See Comments)    Prolonged QT    Review of Systems As per HPI  Objective:  BP 122/71   Pulse 65   Temp 98.3 F (36.8 C)   Ht 5\' 2"  (1.575 m)   Wt 165 lb (74.8 kg)   SpO2 97%   BMI 30.18 kg/m    Wt Readings from Last 3 Encounters:  08/24/23 165 lb (74.8 kg)  07/20/23 169 lb 6.4 oz (76.8 kg)  06/22/23 170 lb (77.1 kg)    Physical Exam Constitutional:      Appearance: She is obese.  Cardiovascular:     Rate and Rhythm: Normal rate and regular rhythm.  Musculoskeletal:     Left shoulder: Tenderness present. No swelling, deformity, effusion, laceration, bony tenderness or crepitus. Decreased range of motion. Decreased strength.       Arms:     Comments: Localized tenderness to palpation, no palpable nodule or swelling Positive Empty Can test. Unable to complete internal rotation exam due to pain.  Pain with forward flexion.   Lymphadenopathy:     Head:     Right side of head: No submental, submandibular, tonsillar, preauricular or posterior auricular adenopathy.     Left side of head: No submental, submandibular, tonsillar, preauricular or posterior auricular adenopathy.  Skin:    General: Skin is warm.     Findings: Rash present. Rash is crusting. Rash is not nodular, purpuric, pustular, scaling, urticarial or vesicular.     Comments: Diffuse, erythematous, open, lesions across left ear  Dry, flaky, erythematous lesion on right ear  Neurological:     General: No focal deficit present.     Mental Status: She is alert and oriented to person, place, and time. Mental status is at baseline.  Psychiatric:        Attention and Perception: Attention and perception normal.        Mood and Affect: Mood and affect normal.        Speech: Speech normal.        Behavior: Behavior normal. Behavior is cooperative.        Thought Content: Thought content normal.        Cognition and Memory: Cognition and memory normal.        Judgment: Judgment normal.         Results for orders placed or performed in visit on 12/25/21  ECHOCARDIOGRAM COMPLETE  Result Value Ref Range   Area-P 1/2 4.24 cm2   S' Lateral 2.60 cm       07/20/2023    2:14 PM 06/22/2023    3:38 PM 04/22/2022   10:43 AM 04/22/2022   10:40 AM 09/21/2021    4:15 PM  Depression screen PHQ 2/9  Decreased Interest 0 0 1 1 1   Down, Depressed, Hopeless 0 0 0 0 1  PHQ - 2 Score 0 0 1 1 2   Altered sleeping 0 0 2 2 1   Tired, decreased energy 0 0 2 2 3   Change in appetite 0 0 2 2 3   Feeling bad or failure about yourself  0 0 0 0 0  Trouble concentrating 0 0 1 1 0  Moving slowly or fidgety/restless 0 0 0 0 2  Suicidal thoughts 0 0 0 0 0  PHQ-9 Score 0 0 8 8 11   Difficult doing work/chores Not difficult at all Not difficult at all Somewhat difficult Somewhat difficult Somewhat difficult       07/20/2023    2:14 PM 06/22/2023    3:38 PM 04/22/2022   10:41 AM 09/21/2021    4:15 PM  GAD 7 : Generalized Anxiety Score  Nervous, Anxious, on Edge 0 0 0 2  Control/stop worrying 0 0 0 2  Worry too much - different things 0 0 0 2  Trouble relaxing 0 0 0 2  Restless 0 0 0 0  Easily annoyed or irritable 0 0 0 0  Afraid - awful might happen 0 0 0 0  Total GAD 7 Score 0 0 0 8  Anxiety Difficulty Not difficult at all Not difficult at all Not difficult at all Somewhat difficult    Pertinent labs & imaging results that  were available during my care of the patient were reviewed by me and considered in my medical decision making.  Assessment & Plan:  Enslie Sahota" was seen today for vaginitis.  Diagnoses and all orders for this visit:  Acute vaginitis Reviewed results of UA and wet prep with patient, both unremarkable. Will await culture results to determine next steps. Discussed adequate hydration and avoiding citric/acidic foods/drinks.  -     WET PREP FOR TRICH, YEAST, CLUE -     Urinalysis, Routine w reflex microscopic -     Urine Culture  Rash Will start medication as below.  Discussed with patient emollients to apply as well. Patient to follow up if symptoms do not improve.  -     triamcinolone (KENALOG) 0.025 % ointment; Apply 1 Application topically 2 (two) times daily.  Left arm pain Referral placed as below. Will provide prednisone for pain. Patient to continue to use OTC treatment options at home.  -     Ambulatory referral to Physical Therapy -     predniSONE (DELTASONE) 20 MG tablet; Take 2 tablets (40 mg total) by mouth daily with breakfast for 5 days.  Continue all other maintenance medications.  Follow up plan: Return if symptoms worsen or fail to improve.   Continue healthy lifestyle choices, including diet (rich in fruits, vegetables, and lean proteins, and low in salt and simple carbohydrates) and exercise (at least 30 minutes of moderate physical activity daily).  Written and verbal instructions provided   The above assessment and management plan was discussed with the patient. The patient verbalized understanding of and has agreed to the management plan. Patient is aware to call the clinic if they develop any new symptoms or if symptoms persist or worsen. Patient is aware when to return to the clinic for a follow-up visit. Patient educated on when it is appropriate to go to the emergency department.   Neale Burly, DNP-FNP [facility] [address] [place], [place] [address] [phone]

## 2023-08-24 NOTE — Patient Instructions (Signed)
Discussed mixing steroid cream and thick tub lotion (Equate Eczema, Cetaphil, Cerave, Aquaphor, or Eucerin) and then applying to affected areas.

## 2023-08-26 LAB — URINE CULTURE

## 2023-09-05 ENCOUNTER — Ambulatory Visit: Payer: BC Managed Care – PPO

## 2023-10-10 DIAGNOSIS — M7502 Adhesive capsulitis of left shoulder: Secondary | ICD-10-CM | POA: Insufficient documentation

## 2023-10-27 NOTE — Progress Notes (Unsigned)
Set up date 09/10/2023

## 2023-10-31 ENCOUNTER — Ambulatory Visit: Payer: BC Managed Care – PPO | Admitting: Adult Health

## 2023-10-31 ENCOUNTER — Encounter: Payer: Self-pay | Admitting: Adult Health

## 2024-01-23 ENCOUNTER — Ambulatory Visit: Admitting: Family Medicine

## 2024-01-23 ENCOUNTER — Encounter: Payer: Self-pay | Admitting: Family Medicine

## 2024-01-23 VITALS — BP 117/77 | HR 73 | Temp 97.2°F | Ht 62.0 in | Wt 167.0 lb

## 2024-01-23 DIAGNOSIS — H608X2 Other otitis externa, left ear: Secondary | ICD-10-CM | POA: Diagnosis not present

## 2024-01-23 DIAGNOSIS — H66005 Acute suppurative otitis media without spontaneous rupture of ear drum, recurrent, left ear: Secondary | ICD-10-CM

## 2024-01-23 MED ORDER — AMOXICILLIN-POT CLAVULANATE 875-125 MG PO TABS
1.0000 | ORAL_TABLET | Freq: Two times a day (BID) | ORAL | 0 refills | Status: DC
Start: 2024-01-23 — End: 2024-05-08

## 2024-01-23 MED ORDER — FLUOCINOLONE ACETONIDE 0.01 % EX SOLN
CUTANEOUS | 0 refills | Status: AC
Start: 2024-01-23 — End: ?

## 2024-01-23 NOTE — Progress Notes (Signed)
 Chief Complaint  Patient presents with   Ear Pain    Left ear. Would liek a referral to ENT. Consistently draining. Pain on and off and few months. Sometimes causes lymphnodes to swelling and eyes to discharge.  *Has alpha gal    HPI  Patient presents today for recurrent left ear pain.Last time was 8 weeks ago. This time started 3 days ago. Sx have recurred frequently for seveal years. Has some mild acute PND. No fever chills. Ear itches. Denies hearing loss.   PMH: Smoking status noted ROS: Per HPI  Objective: BP 117/77   Pulse 73   Temp (!) 97.2 F (36.2 C)   Ht 5\' 2"  (1.575 m)   Wt 167 lb (75.8 kg)   SpO2 97%   BMI 30.54 kg/m  Gen: NAD, alert, cooperative with exam HEENT: NCAT, EOMI, PERRL CV: RRR, good S1/S2, no murmur Resp: CTABL, no wheezes, non-labored Abd: SNTND, BS present, no guarding or organomegaly Ext: No edema, warm Neuro: Alert and oriented, No gross deficits  Assessment and plan:  1. Recurrent acute suppurative otitis media without spontaneous rupture of left tympanic membrane   2. 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 Take all of this medication    Dispense:  20 tablet    Refill:  0   fluocinolone (SYNALAR) 0.01 % external solution    Sig: Dribble 2-4 drops in affected ear 3 times a day until sx clear    Dispense:  60 mL    Refill:  0    Orders Placed This Encounter  Procedures   Ambulatory referral to ENT    Referral Priority:   Routine    Referral Type:   Consultation    Referral Reason:   Specialty Services Required    Requested Specialty:   Otolaryngology    Number of Visits Requested:   1    Follow up as needed.  Mechele Claude, MD

## 2024-01-30 ENCOUNTER — Other Ambulatory Visit (HOSPITAL_COMMUNITY): Payer: Self-pay | Admitting: Nurse Practitioner

## 2024-01-30 DIAGNOSIS — Z1231 Encounter for screening mammogram for malignant neoplasm of breast: Secondary | ICD-10-CM

## 2024-02-10 ENCOUNTER — Ambulatory Visit (HOSPITAL_COMMUNITY): Payer: Self-pay

## 2024-02-24 ENCOUNTER — Ambulatory Visit (HOSPITAL_COMMUNITY)
Admission: RE | Admit: 2024-02-24 | Discharge: 2024-02-24 | Disposition: A | Discharge status: Home / Self Care | Payer: Self-pay | Source: Ambulatory Visit | Attending: Nurse Practitioner | Admitting: Nurse Practitioner

## 2024-02-24 ENCOUNTER — Encounter (HOSPITAL_COMMUNITY): Payer: Self-pay

## 2024-02-24 DIAGNOSIS — Z1231 Encounter for screening mammogram for malignant neoplasm of breast: Secondary | ICD-10-CM | POA: Diagnosis present

## 2024-04-04 ENCOUNTER — Ambulatory Visit

## 2024-04-10 ENCOUNTER — Ambulatory Visit: Admitting: Nurse Practitioner

## 2024-04-10 VITALS — BP 110/73 | HR 77 | Temp 97.1°F | Ht 62.0 in | Wt 171.6 lb

## 2024-04-10 DIAGNOSIS — Z6831 Body mass index (BMI) 31.0-31.9, adult: Secondary | ICD-10-CM | POA: Diagnosis not present

## 2024-04-10 DIAGNOSIS — R079 Chest pain, unspecified: Secondary | ICD-10-CM | POA: Diagnosis not present

## 2024-04-10 DIAGNOSIS — E66811 Obesity, class 1: Secondary | ICD-10-CM | POA: Diagnosis not present

## 2024-04-10 LAB — LIPID PANEL

## 2024-04-10 NOTE — Progress Notes (Addendum)
 Acute Office Visit  Subjective:     Patient ID: [name], female    DOB: [birth year], 63 y.o.   MRN: [id number]  Chief Complaint  Patient presents with   Chest Pain    Chest tightness in the evenings for a few weeks mainly before going to bed    Shoulder Pain    Left shoulder pain for a month     HPI [name] is a 63 year old female who presents on 04/10/2024 for an acute visit with a primary complaint of chest pain ongoing for the past 3 weeks. She describes the pain as occurring mostly at night and in the early morning hours. She denies shortness of breath and denies a history of GERD, noting she has only experienced indigestion twice in her life.  The patient has a remote history of supraventricular tachycardia (SVT) and was previously followed by a cardiologist but has not had cardiology care in over 6 years. She expresses concern about unintentional weight gain of approximately 20 pounds over the past year. She is currently obese with a BMI of 31.39.  She underwent a nuclear stress test on 09/08/2016, which revealed:  No clearly diagnostic ST segment changes over baseline.  Mild hypertensive response.  Low-risk Duke treadmill score of 5.  No significant myocardial perfusion defects to indicate scar or ischemia.  Ejection fraction (EF) of 76%.  She is amenable to obtaining an EKG, wearing a long-term cardiac monitor, and reestablishing care with a cardiologist for further evaluation. Chart review indicates she has only sought medical care for acute illnesses in the past 2 years, with no recent lab work since 2023.  Additionally, the patient reports left shoulder pain for the past month, stating, "it seems the left side is bigger than the right one." She has a history of adhesive capsulitis of the left shoulder.  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    Allergy to alpha-gal 06/28/2018   Acquired hypothyroidism 02/22/2020   Confusion 12/03/2021   Alteration consciousness 12/03/2021   Non-recurrent acute suppurative otitis media of right ear without spontaneous rupture of tympanic membrane 07/20/2023   Chest pain 04/10/2024   Adhesive capsulitis of left shoulder 10/10/2023   Resolved Ambulatory Problems    Diagnosis Date Noted   Shortness of breath 01/15/2011   Atypical chest pain 01/15/2011   Unstable angina (HCC) 07/29/2016   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   Past Medical History:  Diagnosis Date   Cataract    Epstein Barr virus infection    Heart attack (HCC)    Idiopathic anaphylactic reaction    Lyme disease     ROS Negative unless indicated in HPI    Objective:    BP 110/73   Pulse 77   Temp (!) 97.1 F (36.2 C) (Temporal)   Ht 5\' 2"  (1.575 m)   Wt 171 lb 9.6 oz (77.8 kg)   SpO2 97%   BMI 31.39 kg/m  BP Readings from Last 3 Encounters:  04/10/24 110/73  01/23/24 117/77  08/24/23 122/71   Wt Readings from Last 3 Encounters:  04/10/24 171 lb 9.6 oz (77.8 kg)  01/23/24 167 lb (75.8 kg)  08/24/23 165 lb (74.8 kg)      Physical Exam Vitals and nursing note reviewed.  Constitutional:      Appearance: She is obese.  HENT:     Head: Normocephalic and atraumatic.  Nose: Nose normal.     Mouth/Throat:     Mouth: Mucous membranes are moist.  Eyes:     Extraocular Movements: Extraocular movements intact.     Conjunctiva/sclera: Conjunctivae normal.     Pupils: Pupils are equal, round, and reactive to light.  Cardiovascular:     Rate and Rhythm: Normal rate and regular rhythm.  Pulmonary:     Effort: Pulmonary effort is normal.     Breath sounds: Normal breath sounds.  Abdominal:     General: Bowel sounds are normal.     Palpations: Abdomen is soft.  Musculoskeletal:        General: Normal range of motion.     Right shoulder: Normal.     Left shoulder: Tenderness present.       Arms:      Right lower leg: No edema.     Left lower leg: No edema.  Skin:    General: Skin is warm and dry.     Findings: No rash.  Neurological:     Mental Status: She is oriented to person, place, and time.  Psychiatric:        Mood and Affect: Mood normal.        Behavior: Behavior normal.        Thought Content: Thought content normal.        Judgment: Judgment normal.   Interpretation: Sinus Rhythm Rate: 60 BPM PR: 134 msec QT: 410 msec QTcH 410 msec msec QRSD: 100 msec P-QRS-T: 47/46/39 degree   No results found for any visits on 04/10/24.      Assessment & Plan:  Chest pain, unspecified type -     EKG 12-Lead -     Ambulatory referral to Cardiology -     Anemia Profile B -     CMP14+EGFR -     Lipid panel -     Thyroid  Panel With TSH  Class 1 obesity with body mass index (BMI) of 31.0 to 31.9 in adult, unspecified obesity type, unspecified whether serious comorbidity present -     Lipid panel -     Thyroid  Panel With TSH   [name] is a 62 year old Caucasian female seen today for chest pain, no acute distress  chest pain: EKG (see EMR); long-term heart monitor for 14 days; referral to cardiology Lonzell Robin understand that she needs to mail the heart monitor out after 14 days Left shoulder pain: Heat pad every 15 minutes session as tolerated Labs: Anemia profile, CMP, lipid, TSH result pending  Encourage healthy lifestyle choices, including diet (rich in fruits, vegetables, and lean proteins, and low in salt and simple carbohydrates) and exercise (at least 30 minutes of moderate physical activity daily).     The above assessment and management plan was discussed with the patient. The patient verbalized understanding of and has agreed to the management plan. Patient is aware to call the clinic if they develop any new symptoms or if symptoms persist or worsen. Patient is aware when to return to the clinic for a follow-up visit. Patient educated on when it is appropriate to go to  the emergency department.  Return in about 4 weeks (around 05/08/2024) for follow-up with PCP.  Mitsuo Budnick [place], DNP [facility] [address] [place], [place] [address] [phone]  Note: This document was prepared by [employer] voice dictation technology and any errors that results from this process are unintentional.

## 2024-04-11 ENCOUNTER — Ambulatory Visit: Payer: Self-pay | Admitting: Nurse Practitioner

## 2024-04-11 DIAGNOSIS — E782 Mixed hyperlipidemia: Secondary | ICD-10-CM

## 2024-04-11 LAB — ANEMIA PROFILE B
Basophils Absolute: 0.1 10*3/uL (ref 0.0–0.2)
Basos: 1 %
EOS (ABSOLUTE): 0.2 10*3/uL (ref 0.0–0.4)
Eos: 2 %
Ferritin: 54 ng/mL (ref 15–150)
Folate: 14.6 ng/mL (ref >3.0)
Hematocrit: 44.7 % (ref 34.0–46.6)
Hemoglobin: 15.1 g/dL (ref 11.1–15.9)
Immature Grans (Abs): 0 10*3/uL (ref 0.0–0.1)
Immature Granulocytes: 0 %
Iron Saturation: 25 % (ref 15–55)
Iron: 97 ug/dL (ref 27–139)
Lymphocytes Absolute: 3.2 10*3/uL — ABNORMAL HIGH (ref 0.7–3.1)
Lymphs: 47 %
MCH: 31 pg (ref 26.6–33.0)
MCHC: 33.8 g/dL (ref 31.5–35.7)
MCV: 92 fL (ref 79–97)
Monocytes Absolute: 0.5 10*3/uL (ref 0.1–0.9)
Monocytes: 8 %
Neutrophils Absolute: 2.8 10*3/uL (ref 1.4–7.0)
Neutrophils: 42 %
Platelets: 309 10*3/uL (ref 150–450)
RBC: 4.87 x10E6/uL (ref 3.77–5.28)
RDW: 12.5 % (ref 11.7–15.4)
Retic Ct Pct: 1.1 % (ref 0.6–2.6)
Total Iron Binding Capacity: 381 ug/dL (ref 250–450)
UIBC: 284 ug/dL (ref 118–369)
Vitamin B-12: 466 pg/mL (ref 232–1245)
WBC: 6.8 10*3/uL (ref 3.4–10.8)

## 2024-04-11 LAB — CMP14+EGFR
ALT: 30 IU/L (ref 0–32)
AST: 28 IU/L (ref 0–40)
Albumin: 4.5 g/dL (ref 3.9–4.9)
Alkaline Phosphatase: 100 IU/L (ref 44–121)
BUN/Creatinine Ratio: 15 (ref 12–28)
BUN: 13 mg/dL (ref 8–27)
Bilirubin Total: 0.4 mg/dL (ref 0.0–1.2)
CO2: 24 mmol/L (ref 20–29)
Calcium: 9.6 mg/dL (ref 8.7–10.3)
Chloride: 102 mmol/L (ref 96–106)
Creatinine, Ser: 0.88 mg/dL (ref 0.57–1.00)
Globulin, Total: 2.3 g/dL (ref 1.5–4.5)
Glucose: 80 mg/dL (ref 70–99)
Potassium: 4.5 mmol/L (ref 3.5–5.2)
Sodium: 140 mmol/L (ref 134–144)
Total Protein: 6.8 g/dL (ref 6.0–8.5)
eGFR: 74 mL/min/{1.73_m2} (ref >59)

## 2024-04-11 LAB — THYROID PANEL WITH TSH
Free Thyroxine Index: 2.6 (ref 1.2–4.9)
T3 Uptake Ratio: 30 % (ref 24–39)
T4, Total: 8.7 ug/dL (ref 4.5–12.0)
TSH: 1.33 u[IU]/mL (ref 0.450–4.500)

## 2024-04-11 LAB — LIPID PANEL
Cholesterol, Total: 266 mg/dL — ABNORMAL HIGH (ref 100–199)
HDL: 60 mg/dL (ref >39)
LDL CALC COMMENT:: 4.4 ratio (ref 0.0–4.4)
LDL Chol Calc (NIH): 166 mg/dL — ABNORMAL HIGH (ref 0–99)
Triglycerides: 218 mg/dL — ABNORMAL HIGH (ref 0–149)
VLDL Cholesterol Cal: 40 mg/dL (ref 5–40)

## 2024-04-30 ENCOUNTER — Other Ambulatory Visit

## 2024-04-30 DIAGNOSIS — R079 Chest pain, unspecified: Secondary | ICD-10-CM | POA: Diagnosis not present

## 2024-04-30 NOTE — Addendum Note (Signed)
 Addended by: Kyra Phy on: 04/30/2024 02:09 PM   Modules accepted: Orders

## 2024-05-08 ENCOUNTER — Ambulatory Visit: Admitting: Nurse Practitioner

## 2024-05-08 ENCOUNTER — Encounter: Payer: Self-pay | Admitting: Nurse Practitioner

## 2024-05-08 VITALS — BP 123/76 | HR 74 | Temp 97.3°F | Ht 62.0 in | Wt 171.0 lb

## 2024-05-08 DIAGNOSIS — E039 Hypothyroidism, unspecified: Secondary | ICD-10-CM | POA: Diagnosis not present

## 2024-05-08 DIAGNOSIS — N951 Menopausal and female climacteric states: Secondary | ICD-10-CM | POA: Diagnosis not present

## 2024-05-08 DIAGNOSIS — R079 Chest pain, unspecified: Secondary | ICD-10-CM | POA: Diagnosis not present

## 2024-05-08 DIAGNOSIS — F411 Generalized anxiety disorder: Secondary | ICD-10-CM

## 2024-05-08 MED ORDER — FLUOXETINE HCL 10 MG PO TABS: 10.0000 mg | ORAL_TABLET | Freq: Every day | ORAL | 1 refills | Status: AC

## 2024-05-08 MED ORDER — ESTRADIOL 0.075 MG/24HR TD PTTW
1.0000 | MEDICATED_PATCH | TRANSDERMAL | 3 refills | Status: AC
Start: 2024-05-10 — End: ?

## 2024-05-08 MED ORDER — LEVOTHYROXINE SODIUM 50 MCG PO TABS: 50.0000 ug | ORAL_TABLET | Freq: Every day | ORAL | 1 refills | Status: AC

## 2024-05-08 NOTE — Progress Notes (Signed)
 Subjective:    Patient ID: [name], female    DOB: 09/08/1961, 63 y.o.   MRN: [id number]   Chief Complaint: chest pain follow up  HPI  Patient was seen in office on 04/10/24 with chest pain. EKG was normal. Labs looked good. She wore zio for 14 days, with no events. She was told to follow up with cardiology and healthy life style. I do not see where appt was made for cardiology. Patient says she has not heard from cardiology. She has had no chect pain since visit.  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  Eyes:  Negative for pain.  Respiratory:  Negative for shortness of breath.   Cardiovascular:  Negative for chest pain, palpitations and leg swelling.  Gastrointestinal:  Negative for abdominal pain.  Endocrine: Negative for polydipsia.  Skin:  Negative for rash.  Neurological:  Negative for dizziness, weakness and headaches.  Hematological:  Does not bruise/bleed easily.  All other systems reviewed and are negative.      Objective:   Physical Exam Constitutional:      Appearance: Normal appearance.  HENT:     Right Ear: Tympanic membrane normal.     Left Ear: Tympanic membrane normal.     Nose: Nose normal.     Mouth/Throat:     Mouth: Mucous membranes are moist.   Cardiovascular:     Rate and Rhythm: Normal rate and regular rhythm.     Heart sounds: Normal heart sounds.  Pulmonary:     Effort: Pulmonary effort is normal.     Breath sounds: Normal breath sounds.    Musculoskeletal:     Cervical back: Normal range of motion and neck supple.   Skin:    General: Skin is warm.   Neurological:     General: No focal deficit present.     Mental Status: She is alert and oriented to person, place, and time.   Psychiatric:        Mood and Affect: Mood normal.        Behavior: Behavior normal.     BP 123/76   Pulse 74   Temp (!) 97.3 F (36.3 C) (Temporal)   Ht 5' 2 (1.575 m)   Wt 171 lb (77.6 kg)   SpO2 98%   BMI 31.28 kg/m        Assessment & Plan:   [name] in today with chief complaint of 4 week recheck   1. Acquired hypothyroidism Med refill - levothyroxine  (SYNTHROID ) 50 MCG tablet; Take 1 tablet (50 mcg total) by mouth daily.  Dispense: 90 tablet; Refill: 1  2. Anxiety state Stress management Med efill - FLUoxetine  (PROZAC ) 10 MG tablet; Take 1 tablet (10 mg total) by mouth daily.  Dispense: 360 tablet; Refill: 1  3. Menopausal state Med refill - estradiol  (VIVELLE -DOT) 0.075 MG/24HR; Place 1 patch onto the skin 2 (two) times a week.  Dispense: 12 patch; Refill: 3  4. Chest pain, unspecified type (Primary) Report any issues. Make appointment  with cardiology.    The above assessment and management plan was discussed with the patient. The patient verbalized understanding of and has agreed to the management plan. Patient is aware to call the clinic if symptoms persist or worsen. Patient is aware when to return to the clinic for a follow-up visit. Patient educated on when it is appropriate to go to the emergency department.   [name] Gladis, FNP

## 2024-08-29 ENCOUNTER — Encounter: Payer: Self-pay | Admitting: Family Medicine

## 2024-08-29 ENCOUNTER — Ambulatory Visit: Admitting: Family Medicine

## 2024-08-29 VITALS — BP 132/83 | HR 92 | Temp 98.3°F | Ht 62.0 in | Wt 175.6 lb

## 2024-08-29 DIAGNOSIS — J069 Acute upper respiratory infection, unspecified: Secondary | ICD-10-CM | POA: Diagnosis not present

## 2024-08-29 MED ORDER — FLUTICASONE PROPIONATE 50 MCG/ACT NA SUSP: 2.0000 | Freq: Every day | NASAL | 6 refills | Status: AC

## 2024-08-29 MED ORDER — PSEUDOEPH-BROMPHEN-DM 30-2-10 MG/5ML PO SYRP: 5.0000 mL | ORAL_SOLUTION | Freq: Four times a day (QID) | ORAL | 0 refills | Status: AC | PRN

## 2024-08-29 NOTE — Progress Notes (Signed)
 Acute Office Visit  Subjective:     Patient ID: [name], female    DOB: 1961/01/05, 63 y.o.   MRN: [id number]  Chief Complaint  Patient presents with   Fever    Fever     History of Present Illness   [name] is a 63 year old female who presents with a two-day history of cough and fever.  Cough and upper respiratory symptoms - Dry, persistent cough for 2 days - Coughing spasms disrupt sleep - No sputum production - Sore throat present - Nasal congestion significant, interfering with CPAP use (nasal mask) - Headaches occur during coughing attacks, described as band-like, with previous episodes at the back of the head  Fever and constitutional symptoms - Fever up to 102F initially, resolved after last episode last night - Sweating followed fever resolution, indicating fever broke - Feels improved today compared to earlier in the week  Associated and negative symptoms - No shortness of breath - No wheezing - No chest pain - No nausea, vomiting, or diarrhea - No ear pain  Symptomatic treatment - Tylenol  and ibuprofen used for symptom relief - Robitussin taken for cough - Flonase used for nasal congestion, only a small amount available, used to facilitate CPAP use  Recent travel and relevant history - Recent travel from [facility] a few days prior to symptom onset - History of alpha-gal syndrome, limiting medication options, particularly concerned about Tamiflu ingredients       Review of Systems  Constitutional:  Positive for fever.   As per HPI.      Objective:    BP 132/83   Pulse 92   Temp 98.3 F (36.8 C) (Temporal)   Ht 5' 2 (1.575 m)   Wt 175 lb 9.6 oz (79.7 kg)   SpO2 96%   BMI 32.12 kg/m    Physical Exam Vitals and nursing note reviewed.  Constitutional:      General: She is not in acute distress.    Appearance: She is not ill-appearing, toxic-appearing or diaphoretic.  HENT:     Head: Normocephalic and  atraumatic.     Right Ear: Tympanic membrane, ear canal and external ear normal.     Left Ear: Tympanic membrane, ear canal and external ear normal.     Nose: Congestion present.     Mouth/Throat:     Mouth: Mucous membranes are moist.     Pharynx: Posterior oropharyngeal erythema present. No pharyngeal swelling, oropharyngeal exudate, uvula swelling or postnasal drip.     Tonsils: No tonsillar exudate. 1+ on the right. 1+ on the left.  Eyes:     General:        Right eye: No discharge.        Left eye: No discharge.  Cardiovascular:     Rate and Rhythm: Normal rate and regular rhythm.     Heart sounds: Normal heart sounds. No murmur heard. Pulmonary:     Effort: Pulmonary effort is normal. No respiratory distress.     Breath sounds: Normal breath sounds. No wheezing, rhonchi or rales.  Chest:     Chest wall: No tenderness.  Musculoskeletal:     Cervical back: Neck supple.     Right lower leg: No edema.     Left lower leg: No edema.  Skin:    General: Skin is warm and dry.  Neurological:     General: No focal deficit present.     Mental Status: She is alert  and oriented to person, place, and time.  Psychiatric:        Mood and Affect: Mood normal.        Behavior: Behavior normal.     No results found for any visits on 08/29/24.      Assessment & Plan:   [name] was seen today for fever.  Diagnoses and all orders for this visit:  Viral URI with cough -     brompheniramine-pseudoephedrine-DM 30-2-10 MG/5ML syrup; Take 5 mLs by mouth 4 (four) times daily as needed. -     fluticasone (FLONASE) 50 MCG/ACT nasal spray; Place 2 sprays into both nostrils daily.   Assessment and Plan    Acute upper respiratory infection Likely viral etiology. Symptoms improving with OTC medications. Expected resolution in 7-10 days. She has declined testing for Covid or flu today.  - Prescribed liquid medication for cough and congestion, up to four times daily as needed. -  Continue flonase, antipyretics as needed.  - Advised to monitor symptoms and return if productive cough, shortness of breath, wheezing, chest pain, or recurrent fever after initial resolution.      Return if symptoms worsen or fail to improve.  The patient indicates understanding of these issues and agrees with the plan.  Ariel CHRISTELLA Search, FNP

## 2024-09-06 ENCOUNTER — Other Ambulatory Visit: Payer: Self-pay | Admitting: Family Medicine

## 2024-09-06 DIAGNOSIS — J069 Acute upper respiratory infection, unspecified: Secondary | ICD-10-CM

## 2024-10-08 ENCOUNTER — Ambulatory Visit: Admitting: Family Medicine

## 2024-10-08 ENCOUNTER — Ambulatory Visit: Payer: Self-pay | Admitting: Family Medicine

## 2024-10-08 ENCOUNTER — Encounter: Payer: Self-pay | Admitting: Family Medicine

## 2024-10-08 VITALS — BP 108/77 | HR 90 | Temp 97.9°F | Ht 62.0 in | Wt 179.0 lb

## 2024-10-08 DIAGNOSIS — R6889 Other general symptoms and signs: Secondary | ICD-10-CM

## 2024-10-08 DIAGNOSIS — J40 Bronchitis, not specified as acute or chronic: Secondary | ICD-10-CM

## 2024-10-08 DIAGNOSIS — J329 Chronic sinusitis, unspecified: Secondary | ICD-10-CM

## 2024-10-08 LAB — VERITOR SARS-COV-2 AND FLU A+B
BD Veritor SARS-CoV-2 Ag: NEGATIVE
Influenza A: NEGATIVE
Influenza B: NEGATIVE

## 2024-10-08 LAB — CULTURE, GROUP A STREP

## 2024-10-08 LAB — RAPID STREP SCREEN (MED CTR MEBANE ONLY): Strep Gp A Ag, IA W/Reflex: NEGATIVE

## 2024-10-08 MED ORDER — AMOXICILLIN-POT CLAVULANATE 875-125 MG PO TABS: 1.0000 | ORAL_TABLET | Freq: Two times a day (BID) | ORAL | 0 refills | Status: AC

## 2024-10-08 MED ORDER — PROMETHAZINE-DM 6.25-15 MG/5ML PO SYRP: 5.0000 mL | ORAL_SOLUTION | Freq: Four times a day (QID) | ORAL | 0 refills | Status: AC | PRN

## 2024-10-08 MED ORDER — PSEUDOEPHEDRINE-GUAIFENESIN ER 60-600 MG PO TB12: 1.0000 | ORAL_TABLET | Freq: Two times a day (BID) | ORAL | 0 refills | Status: AC

## 2024-10-08 NOTE — Progress Notes (Signed)
 Chief Complaint  Patient presents with   sick    Started 3 days ago. Cough, sore throat, left side lymph nodes swollen, ear ache on left side.    HPI  Patient presents today for Patient presents with upper respiratory congestion. Rhinorrhea that is frequently purulent. There is moderate sore throat. Patient reports coughing frequently as well.  Some sputum noted. There is no fever, chills or sweats. The patient denies being short of breath. Onset was 3-5 days ago. Gradually worsening. Tried OTCs without improvement.   Discussed the use of AI scribe software for clinical note transcription with the patient, who gave verbal consent to proceed.  History of Present Illness [name] is a 63 year old female who presents with symptoms of an upper respiratory infection including lymph node swelling, ear pain, and nonproductive cough.  Her symptoms began three days ago and typically worsen over a five-day period, leading to a week of being bedridden. She is seeking treatment to prevent this progression. Symptoms include swollen lymph nodes, ear pain, and a nonproductive cough. She feels generally unwell, with tenderness in the left jaw and sensitivity in the ear, accompanied by some fluid, likely due to sinus congestion and infection.  She recalls experiencing similar symptoms about two months ago and is uncertain if she fully recovered from that episode. She notes a pattern of sneezing three times in a row as an indicator of becoming sick. She experiences some post-nasal drainage but not in conjunction with her cough. Additionally, there is an increase in snoring, which has worsened over the past three days, despite using a CPAP machine.  Her current medications include Flonase  (fluticasone  nasal spray). She has a severe alpha-gal allergy, which limits her antibiotic options, but she can tolerate Augmentin  (amoxicillin /clavulanate). She has a history of supraventricular tachycardia  (SVT) and atrial fibrillation (AFib), which have been triggered by alpha-gal exposure. She has previously used a cough syrup containing pseudoephedrine  without adverse reactions.  No significant sneezing and sniffling, except for her usual three sneezes before getting sick. Some post-nasal drainage but not associated with her cough. Confirms an increase in snoring over the past three days.   PMH: Smoking status noted Review of Systems  Objective: BP 108/77   Pulse 90   Temp 97.9 F (36.6 C)   Ht 5' 2 (1.575 m)   Wt 179 lb (81.2 kg)   SpO2 99%   BMI 32.74 kg/m  Gen: NAD, alert, cooperative with exam HEENT: NCAT, Nasal passages swollen, red TMS RED CV: RRR, good S1/S2, no murmur Resp: Bronchitis changes with scattered wheezes, non-labored Ext: No edema, warm Neuro: Alert and oriented, No gross deficits  Sinobronchitis  Flu-like symptoms -     Veritor SARS-CoV-2 and Flu A+B -     Rapid Strep Screen ([facility] ONLY)  Other orders -     Amoxicillin -Pot Clavulanate; Take 1 tablet by mouth 2 (two) times daily. Take all of this medication  Dispense: 20 tablet; Refill: 0 -     Pseudoephedrine -guaiFENesin  ER; Take 1 tablet by mouth every 12 (twelve) hours for 10 days. As needed for congestion  Dispense: 20 tablet; Refill: 0 -     Promethazine -DM; Take 5 mLs by mouth 4 (four) times daily as needed for cough.  Dispense: 240 mL; Refill: 0  - all negative Assessment and Plan Assessment & Plan Acute sinusitis with associated left otitis media   She presents with acute sinusitis and left otitis media, accompanied by lymphadenopathy, nonproductive  cough, and ear sensitivity. Flu and COVID tests are negative. Fluid in the left ear is likely due to sinus congestion and infection, with symptoms persisting for three days and expected to last about a week. Prescribe Augmentin  for sinusitis and otitis media. Use Flonase  nasal spray, two sprays on each side twice daily until symptoms resolve.  Educate on proper Flonase  technique: spray directly back, not up, and avoid snorting. Recommend Mucinex  D for decongestion, with caution due to history of SVT and AFib. Monitor for heart palpitations and discontinue if she occurs.  Cough   She has a nonproductive cough associated with sinusitis, with no significant lung findings on examination. Prescribe promethazine  DM for cough.  Allergic rhinitis (suspected)   Suspected allergic rhinitis may be contributing to sinusitis symptoms. Although no significant sneezing or sniffling is reported, three sneezes were noted as a precursor to illness. Continue Flonase  nasal spray as per sinusitis plan.  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 Mucinex  D is chosen with caution due to pseudoephedrine  content, but no previous adverse reactions are noted. Monitor for heart palpitations with Mucinex  D use and discontinue if she occurs. Follow up as needed Butler Der, MD

## 2024-12-25 ENCOUNTER — Ambulatory Visit: Admitting: Nurse Practitioner
